# Patient Record
Sex: Male | Born: 1960
Health system: Southern US, Community
[De-identification: ages and names within clinical notes are randomized; demographics above are authoritative.]

## PROBLEM LIST (undated history)

## (undated) DIAGNOSIS — N429 Disorder of prostate, unspecified: Secondary | ICD-10-CM

## (undated) DIAGNOSIS — I4819 Other persistent atrial fibrillation: Secondary | ICD-10-CM

## (undated) DIAGNOSIS — G4733 Obstructive sleep apnea (adult) (pediatric): Secondary | ICD-10-CM

## (undated) DIAGNOSIS — F419 Anxiety disorder, unspecified: Secondary | ICD-10-CM

## (undated) DIAGNOSIS — Z9889 Other specified postprocedural states: Secondary | ICD-10-CM

## (undated) DIAGNOSIS — G2581 Restless legs syndrome: Secondary | ICD-10-CM

## (undated) DIAGNOSIS — R112 Nausea with vomiting, unspecified: Secondary | ICD-10-CM

## (undated) DIAGNOSIS — R011 Cardiac murmur, unspecified: Secondary | ICD-10-CM

## (undated) DIAGNOSIS — F32A Depression, unspecified: Secondary | ICD-10-CM

## (undated) DIAGNOSIS — Z9989 Dependence on other enabling machines and devices: Secondary | ICD-10-CM

## (undated) DIAGNOSIS — K219 Gastro-esophageal reflux disease without esophagitis: Secondary | ICD-10-CM

## (undated) DIAGNOSIS — M1990 Unspecified osteoarthritis, unspecified site: Secondary | ICD-10-CM

## (undated) DIAGNOSIS — E785 Hyperlipidemia, unspecified: Secondary | ICD-10-CM

## (undated) DIAGNOSIS — L299 Pruritus, unspecified: Secondary | ICD-10-CM

## (undated) DIAGNOSIS — F329 Major depressive disorder, single episode, unspecified: Secondary | ICD-10-CM

## (undated) HISTORY — PX: FOOT SURGERY: SHX648

## (undated) HISTORY — DX: Depression, unspecified: F32.A

## (undated) HISTORY — DX: Cardiac murmur, unspecified: R01.1

## (undated) HISTORY — DX: Pruritus, unspecified: L29.9

## (undated) HISTORY — PX: TRANSTHORACIC ECHOCARDIOGRAM: SHX275

## (undated) HISTORY — DX: Obstructive sleep apnea (adult) (pediatric): G47.33

## (undated) HISTORY — PX: SHOULDER SURGERY: SHX246

## (undated) HISTORY — DX: Gastro-esophageal reflux disease without esophagitis: K21.9

## (undated) HISTORY — DX: Anxiety disorder, unspecified: F41.9

## (undated) HISTORY — DX: Dependence on other enabling machines and devices: Z99.89

## (undated) HISTORY — DX: Hyperlipidemia, unspecified: E78.5

## (undated) HISTORY — DX: Other persistent atrial fibrillation: I48.19

## (undated) HISTORY — DX: Major depressive disorder, single episode, unspecified: F32.9

## (undated) HISTORY — DX: Unspecified osteoarthritis, unspecified site: M19.90

## (undated) HISTORY — DX: Disorder of prostate, unspecified: N42.9

## (undated) HISTORY — PX: VASECTOMY: SHX75

## (undated) HISTORY — DX: Restless legs syndrome: G25.81

---

## 2005-02-08 ENCOUNTER — Emergency Department: Payer: Self-pay | Admitting: Emergency Medicine

## 2005-06-25 ENCOUNTER — Ambulatory Visit: Payer: Self-pay | Admitting: Internal Medicine

## 2006-12-14 HISTORY — PX: CARDIAC CATHETERIZATION: SHX172

## 2007-03-15 HISTORY — PX: NM MYOVIEW LTD: HXRAD82

## 2007-03-24 ENCOUNTER — Ambulatory Visit: Payer: Self-pay | Admitting: Cardiovascular Disease

## 2007-12-11 ENCOUNTER — Other Ambulatory Visit: Payer: Self-pay

## 2007-12-11 ENCOUNTER — Emergency Department: Payer: Self-pay | Admitting: Emergency Medicine

## 2008-07-03 ENCOUNTER — Ambulatory Visit: Payer: Self-pay

## 2010-02-12 ENCOUNTER — Ambulatory Visit: Payer: Self-pay | Admitting: Cardiovascular Disease

## 2011-09-21 ENCOUNTER — Ambulatory Visit: Payer: Self-pay

## 2011-10-13 ENCOUNTER — Ambulatory Visit: Payer: Self-pay | Admitting: General Practice

## 2011-10-13 DIAGNOSIS — I4891 Unspecified atrial fibrillation: Secondary | ICD-10-CM

## 2011-10-28 ENCOUNTER — Ambulatory Visit: Payer: Self-pay | Admitting: General Practice

## 2011-12-15 HISTORY — PX: NM MYOVIEW LTD: HXRAD82

## 2011-12-23 ENCOUNTER — Ambulatory Visit: Payer: Self-pay | Admitting: Physician Assistant

## 2013-01-25 ENCOUNTER — Encounter: Payer: Self-pay | Admitting: *Deleted

## 2013-02-03 ENCOUNTER — Ambulatory Visit (INDEPENDENT_AMBULATORY_CARE_PROVIDER_SITE_OTHER): Payer: 59 | Admitting: Cardiovascular Disease

## 2013-02-03 ENCOUNTER — Encounter: Payer: Self-pay | Admitting: Cardiovascular Disease

## 2013-02-03 VITALS — BP 118/72 | HR 72 | Ht 71.0 in | Wt 191.5 lb

## 2013-02-03 DIAGNOSIS — I480 Paroxysmal atrial fibrillation: Secondary | ICD-10-CM

## 2013-02-03 DIAGNOSIS — E785 Hyperlipidemia, unspecified: Secondary | ICD-10-CM

## 2013-02-03 DIAGNOSIS — I2510 Atherosclerotic heart disease of native coronary artery without angina pectoris: Secondary | ICD-10-CM

## 2013-02-03 DIAGNOSIS — I4891 Unspecified atrial fibrillation: Secondary | ICD-10-CM

## 2013-02-03 NOTE — Patient Instructions (Addendum)
Continue same medications  Follow up in 1 year

## 2013-02-03 NOTE — Assessment & Plan Note (Signed)
He is maintaining in sinus rhythm with flecainide without any recent episodes. Continue current treatment. We discussed briefly today the option of radiofrequency ablation if he has recurrent atrial fibrillation in spite of being on flecainide. He had cardiac workup done within the last 6 months as outlined above.

## 2013-02-03 NOTE — Progress Notes (Signed)
HPI  This is a 52 year old male who is here today to reestablish cardiovascular care with me and transfer care from Dr. Welton Flakes. I saw him in the past [facility] for paroxysmal atrial fibrillation which was diagnosed in 2008. He has been maintaining in sinus rhythm on flecainide for many years. He had cardiac catheterization done in 2008 which showed no evidence of obstructive coronary artery disease. There was a 40% stenosis in the mid LAD. She reports no recent atrial fibrillation. He did have palpitations and likely A. fib last year in the summer. He had an echocardiogram done in July 2013 which showed normal LV systolic function with mildly dilated left atrium at 4.5 cm, mild left ventricular hypertrophy and mild tricuspid and mitral regurgitation. He also underwent a treadmill nuclear stress test which showed no evidence of ischemia with normal ejection fraction. He does have known history of sleep apnea on CPAP. He is doing very well at this time. 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 He denies any chest pain, dyspnea or palpitations.  No Known Allergies   Current Outpatient Prescriptions on File Prior to Visit  Medication Sig Dispense Refill  . aspirin 81 MG tablet Take 81 mg by mouth daily.      . flecainide (TAMBOCOR) 100 MG tablet Takes 3 tablets daily.      . sertraline (ZOLOFT) 100 MG tablet Take 100 mg by mouth daily.      . simvastatin (ZOCOR) 20 MG tablet Take 20 mg by mouth daily.       No current facility-administered medications on file prior to visit.     Past Medical History  Diagnosis Date  . Sleep apnea   . Hx of hyperlipidemia   . CAD (coronary artery disease)     Cardiac catheterization in April of 2008 : No obstructive coronary artery disease with 40 % mid LAD stenosis  . Hyperlipidemia   . Paroxysmal atrial fibrillation      Past Surgical History  Procedure Laterality Date  . Shoulder surgery Left   . Cardiac  catheterization  2008    [facility]     Family History  Problem Relation Age of Onset  . Hypertension Mother      History   Social History  . Marital Status: Married    Spouse Name: N/A    Number of Children: N/A  . Years of Education: N/A   Occupational History  . Not on file.   Social History Main Topics  . Smoking status: Former Smoker -- 0.50 packs/day for 10 years    Types: Cigarettes  . Smokeless tobacco: Not on file  . Alcohol Use: Yes     Comment: moderate  . Drug Use: No  . Sexually Active: Not on file   Other Topics Concern  . Not on file   Social History Narrative  . No narrative on file     ROS Constitutional: Negative for fever, chills, diaphoresis, activity change, appetite change and fatigue.  HENT: Negative for hearing loss, nosebleeds, congestion, sore throat, facial swelling, drooling, trouble swallowing, neck pain, voice change, sinus pressure and tinnitus.  Eyes: Negative for photophobia, pain, discharge and visual disturbance.  Respiratory: Negative for apnea, cough, chest tightness, shortness of breath and wheezing.  Cardiovascular: Negative for chest pain, palpitations and leg swelling.  Gastrointestinal: Negative for nausea, vomiting, abdominal pain, diarrhea, constipation, blood in stool and abdominal distention.  Genitourinary: Negative for dysuria, urgency, frequency, hematuria and  decreased urine volume.  Musculoskeletal: Negative for myalgias, back pain, joint swelling, arthralgias and gait problem.  Skin: Negative for color change, pallor, rash and wound.  Neurological: Negative for dizziness, tremors, seizures, syncope, speech difficulty, weakness, light-headedness, numbness and headaches.  Psychiatric/Behavioral: Negative for suicidal ideas, hallucinations, behavioral problems and agitation. The patient is not nervous/anxious.     PHYSICAL EXAM   BP 118/72  Pulse 72  Ht 5\' 11"  (1.803 m)  Wt 191 lb 8 oz (86.864 kg)  BMI 26.72  kg/m2 Constitutional: He is oriented to person, place, and time. He appears well-developed and well-nourished. No distress.  HENT: No nasal discharge.  Head: Normocephalic and atraumatic.  Eyes: Pupils are equal and round. Right eye exhibits no discharge. Left eye exhibits no discharge.  Neck: Normal range of motion. Neck supple. No JVD present. No thyromegaly present.  Cardiovascular: Normal rate, regular rhythm, normal heart sounds and. Exam reveals no gallop and no friction rub. No murmur heard.  Pulmonary/Chest: Effort normal and breath sounds normal. No stridor. No respiratory distress. He has no wheezes. He has no rales. He exhibits no tenderness.  Abdominal: Soft. Bowel sounds are normal. He exhibits no distension. There is no tenderness. There is no rebound and no guarding.  Musculoskeletal: Normal range of motion. He exhibits no edema and no tenderness.  Neurological: He is alert and oriented to person, place, and time. Coordination normal.  Skin: Skin is warm and dry. No rash noted. He is not diaphoretic. No erythema. No pallor.  Psychiatric: He has a normal mood and affect. His behavior is normal. Judgment and thought content normal.       EKG: Normal sinus rhythm with nonspecific IVCD and left axis deviation.   ASSESSMENT AND PLAN

## 2013-02-03 NOTE — Assessment & Plan Note (Signed)
He does have known history of mild nonobstructive coronary artery disease. He is currently on simvastatin 20 mg daily without any reported side effects.

## 2013-03-01 ENCOUNTER — Ambulatory Visit: Payer: Self-pay | Admitting: Gastroenterology

## 2013-03-02 LAB — PATHOLOGY REPORT

## 2013-11-17 ENCOUNTER — Other Ambulatory Visit: Payer: Self-pay | Admitting: *Deleted

## 2013-11-17 MED ORDER — FLECAINIDE ACETATE 100 MG PO TABS: ORAL_TABLET | ORAL | Status: DC

## 2013-11-17 NOTE — Telephone Encounter (Signed)
Requested Prescriptions   Signed Prescriptions Disp Refills  . flecainide (TAMBOCOR) 100 MG tablet 90 tablet 3    Sig: Takes 3 tablets daily.    Authorizing Provider: Lorine Bears A    Ordering User: Kendrick Fries

## 2014-02-20 ENCOUNTER — Ambulatory Visit (INDEPENDENT_AMBULATORY_CARE_PROVIDER_SITE_OTHER): Payer: 59 | Admitting: Cardiovascular Disease

## 2014-02-20 ENCOUNTER — Encounter: Payer: Self-pay | Admitting: Cardiovascular Disease

## 2014-02-20 VITALS — BP 121/79 | HR 72 | Ht 71.0 in | Wt 185.2 lb

## 2014-02-20 DIAGNOSIS — I480 Paroxysmal atrial fibrillation: Secondary | ICD-10-CM

## 2014-02-20 DIAGNOSIS — E785 Hyperlipidemia, unspecified: Secondary | ICD-10-CM

## 2014-02-20 DIAGNOSIS — I4891 Unspecified atrial fibrillation: Secondary | ICD-10-CM

## 2014-02-20 MED ORDER — FLECAINIDE ACETATE 150 MG PO TABS: 150.0000 mg | ORAL_TABLET | Freq: Two times a day (BID) | ORAL | Status: DC

## 2014-02-20 NOTE — Assessment & Plan Note (Signed)
Continue treatment with simvastatin. He is going to have labs done with Dr. Dossie Arbourrissman in the near future.

## 2014-02-20 NOTE — Progress Notes (Signed)
Primary care physician: Dr. Dossie Arbour  HPI  This is a 53 year old male who is here today for a followup visit regarding paroxysmal atrial fibrillation maintained in sinus rhythm with flecainide. He was diagnosed with paroxysmal atrial fibrillation in 2008. He has been maintaining in sinus rhythm on flecainide since then. He had cardiac catheterization done in 2008 which showed no evidence of obstructive coronary artery disease. There was a 40% stenosis in the mid LAD. Most recent echocardiogram echocardiogram done in July 2013 showed normal LV systolic function with mildly dilated left atrium at 4.5 cm, mild left ventricular hypertrophy and mild tricuspid and mitral regurgitation.  He had a treadmill nuclear stress test in 2013, and which showed no evidence of ischemia with normal ejection fraction. He does have known history of sleep apnea on CPAP. He is doing very well at this time. 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 He denies any chest pain, dyspnea or palpitations.  No Known Allergies   Current Outpatient Prescriptions on File Prior to Visit  Medication Sig Dispense Refill  . aspirin 81 MG tablet Take 81 mg by mouth daily.      . flecainide (TAMBOCOR) 100 MG tablet Takes 3 tablets daily.  90 tablet  3  . gabapentin (NEURONTIN) 100 MG capsule Take 100 mg by mouth daily.      . sertraline (ZOLOFT) 100 MG tablet Take 100 mg by mouth daily.      . simvastatin (ZOCOR) 20 MG tablet Take 20 mg by mouth daily.       No current facility-administered medications on file prior to visit.     Past Medical History  Diagnosis Date  . Sleep apnea   . Hx of hyperlipidemia   . CAD (coronary artery disease)     Cardiac catheterization in April of 2008 : No obstructive coronary artery disease with 40 % mid LAD stenosis  . Hyperlipidemia   . Paroxysmal atrial fibrillation      Past Surgical History  Procedure Laterality Date  . Shoulder surgery Left     . Cardiac catheterization  2008    [facility]     Family History  Problem Relation Age of Onset  . Hypertension Mother      History   Social History  . Marital Status: Married    Spouse Name: N/A    Number of Children: N/A  . Years of Education: N/A   Occupational History  . Not on file.   Social History Main Topics  . Smoking status: Former Smoker -- 0.50 packs/day for 10 years    Types: Cigarettes  . Smokeless tobacco: Not on file  . Alcohol Use: Yes     Comment: moderate  . Drug Use: No  . Sexual Activity: Not on file   Other Topics Concern  . Not on file   Social History Narrative  . No narrative on file        PHYSICAL EXAM   BP 121/79  Pulse 72  Ht 5\' 11"  (1.803 m)  Wt 185 lb 4 oz (84.029 kg)  BMI 25.85 kg/m2 Constitutional: He is oriented to person, place, and time. He appears well-developed and well-nourished. No distress.  HENT: No nasal discharge.  Head: Normocephalic and atraumatic.  Eyes: Pupils are equal and round. Right eye exhibits no discharge. Left eye exhibits no discharge.  Neck: Normal range of motion. Neck supple. No JVD present. No thyromegaly present.  Cardiovascular: Normal rate, regular rhythm,  normal heart sounds and. Exam reveals no gallop and no friction rub. No murmur heard.  Pulmonary/Chest: Effort normal and breath sounds normal. No stridor. No respiratory distress. He has no wheezes. He has no rales. He exhibits no tenderness.  Abdominal: Soft. Bowel sounds are normal. He exhibits no distension. There is no tenderness. There is no rebound and no guarding.  Musculoskeletal: Normal range of motion. He exhibits no edema and no tenderness.  Neurological: He is alert and oriented to person, place, and time. Coordination normal.  Skin: Skin is warm and dry. No rash noted. He is not diaphoretic. No erythema. No pallor.  Psychiatric: He has a normal mood and affect. His behavior is normal. Judgment and thought content normal.        ZOX:WRUEAEKG:Sinus  Rhythm  -RSR(V1) -nondiagnostic.   -Nonspecific ST depression  -Nondiagnostic. Normal PR and QT intervals  ABNORMAL    ASSESSMENT AND PLAN

## 2014-02-20 NOTE — Patient Instructions (Addendum)
Change Flecainide to 150 mg twice daily.    Your physician wants you to follow-up in: 12 months.  You will receive a reminder letter in the mail two months in advance. If you don't receive a letter, please call our office to schedule the follow-up appointment.

## 2014-02-20 NOTE — Assessment & Plan Note (Signed)
He is maintaining in sinus rhythm with flecainide. He has been taking 300 mg once daily. I advised him to change that to 150 mg twice daily given that this is not a sustained-release. I will plan on obtaining an echocardiogram next year to ensure no significant structural abnormalities or left ventricular hypertrophy.

## 2014-03-10 ENCOUNTER — Ambulatory Visit: Payer: Self-pay | Admitting: General Practice

## 2015-02-26 ENCOUNTER — Encounter: Payer: Self-pay | Admitting: Cardiovascular Disease

## 2015-02-26 ENCOUNTER — Ambulatory Visit (INDEPENDENT_AMBULATORY_CARE_PROVIDER_SITE_OTHER): Payer: 59 | Admitting: Cardiovascular Disease

## 2015-02-26 VITALS — BP 120/76 | HR 81 | Ht 71.5 in | Wt 179.8 lb

## 2015-02-26 DIAGNOSIS — I480 Paroxysmal atrial fibrillation: Secondary | ICD-10-CM

## 2015-02-26 DIAGNOSIS — E785 Hyperlipidemia, unspecified: Secondary | ICD-10-CM

## 2015-02-26 MED ORDER — FLECAINIDE ACETATE 150 MG PO TABS: 150.0000 mg | ORAL_TABLET | Freq: Two times a day (BID) | ORAL | Status: DC

## 2015-02-26 NOTE — Progress Notes (Signed)
Primary care physician: Dr. Dossie Arbourrissman  HPI  This is a 54 year old male who is here today for a followup visit regarding paroxysmal atrial fibrillation maintained in sinus rhythm with flecainide. He was diagnosed with paroxysmal atrial fibrillation in 2008. He has been maintaining in sinus rhythm on flecainide since then. He had cardiac catheterization done in 2008 which showed no evidence of obstructive coronary artery disease. There was a 40% stenosis in the mid LAD. Most recent echocardiogram echocardiogram done in July 2013 showed normal LV systolic function with mildly dilated left atrium at 4.5 cm, mild left ventricular hypertrophy and mild tricuspid and mitral regurgitation.  He had a treadmill nuclear stress test in 2013, and which showed no evidence of ischemia with normal ejection fraction. He does have known history of sleep apnea on CPAP. He is doing very well at this time. 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 He denies any chest pain, dyspnea or palpitations. He lost at least 10 pounds over the last 2 years with improvement and hyperlipidemia and overall stamina. He had only brief episodes of palpitations with no prolonged tachycardia.  No Known Allergies   Current Outpatient Prescriptions on File Prior to Visit  Medication Sig Dispense Refill  . aspirin 81 MG tablet Take 81 mg by mouth daily.    Marland Kitchen. gabapentin (NEURONTIN) 100 MG capsule Take 100 mg by mouth daily.    . sertraline (ZOLOFT) 100 MG tablet Take 100 mg by mouth daily.     No current facility-administered medications on file prior to visit.     Past Medical History  Diagnosis Date  . Sleep apnea   . Hx of hyperlipidemia   . CAD (coronary artery disease)     Cardiac catheterization in April of 2008 : No obstructive coronary artery disease with 40 % mid LAD stenosis  . Hyperlipidemia   . Paroxysmal atrial fibrillation      Past Surgical History  Procedure Laterality  Date  . Shoulder surgery Left   . Cardiac catheterization  2008    [facility]     Family History  Problem Relation Age of Onset  . Hypertension Mother      History   Social History  . Marital Status: Married    Spouse Name: N/A  . Number of Children: N/A  . Years of Education: N/A   Occupational History  . Not on file.   Social History Main Topics  . Smoking status: Former Smoker -- 0.50 packs/day for 10 years    Types: Cigarettes  . Smokeless tobacco: Not on file  . Alcohol Use: Yes     Comment: moderate  . Drug Use: No  . Sexual Activity: Not on file   Other Topics Concern  . Not on file   Social History Narrative        PHYSICAL EXAM   BP 120/76 mmHg  Pulse 81  Ht 5' 11.5" (1.816 m)  Wt 179 lb 12 oz (81.534 kg)  BMI 24.72 kg/m2 Constitutional: He is oriented to person, place, and time. He appears well-developed and well-nourished. No distress.  HENT: No nasal discharge.  Head: Normocephalic and atraumatic.  Eyes: Pupils are equal and round. Right eye exhibits no discharge. Left eye exhibits no discharge.  Neck: Normal range of motion. Neck supple. No JVD present. No thyromegaly present.  Cardiovascular: Normal rate, regular rhythm, normal heart sounds and. Exam reveals no gallop and no friction rub. No murmur heard.  Pulmonary/Chest:  Effort normal and breath sounds normal. No stridor. No respiratory distress. He has no wheezes. He has no rales. He exhibits no tenderness.  Abdominal: Soft. Bowel sounds are normal. He exhibits no distension. There is no tenderness. There is no rebound and no guarding.  Musculoskeletal: Normal range of motion. He exhibits no edema and no tenderness.  Neurological: He is alert and oriented to person, place, and time. Coordination normal.  Skin: Skin is warm and dry. No rash noted. He is not diaphoretic. No erythema. No pallor.  Psychiatric: He has a normal mood and affect. His behavior is normal. Judgment and thought content  normal.       ZOX:WRUEA  Rhythm  -Nonspecific QRS widening and anterior fascicular block.   ABNORMAL    ASSESSMENT AND PLAN

## 2015-02-26 NOTE — Assessment & Plan Note (Signed)
He is maintaining in sinus rhythm on flecainide. I made no changes in medications. I requested an echocardiogram to ensure no structural heart abnormalities.

## 2015-02-26 NOTE — Assessment & Plan Note (Signed)
He has lost weight and was able to get off simvastatin.

## 2015-02-26 NOTE — Patient Instructions (Signed)

## 2015-03-08 ENCOUNTER — Other Ambulatory Visit: Payer: 59

## 2015-03-19 ENCOUNTER — Other Ambulatory Visit (INDEPENDENT_AMBULATORY_CARE_PROVIDER_SITE_OTHER): Payer: 59

## 2015-03-19 ENCOUNTER — Other Ambulatory Visit: Payer: Self-pay

## 2015-03-19 DIAGNOSIS — I480 Paroxysmal atrial fibrillation: Secondary | ICD-10-CM | POA: Diagnosis not present

## 2015-04-17 ENCOUNTER — Other Ambulatory Visit (HOSPITAL_COMMUNITY)
Admission: RE | Admit: 2015-04-17 | Discharge: 2015-04-17 | Disposition: A | Discharge status: Home / Self Care | Payer: 59 | Source: Ambulatory Visit | Attending: Family Medicine | Admitting: Family Medicine

## 2015-04-17 DIAGNOSIS — E784 Other hyperlipidemia: Secondary | ICD-10-CM | POA: Insufficient documentation

## 2015-04-17 LAB — LIPID PANEL
CHOLESTEROL: 190 mg/dL (ref 0–200)
HDL: 31 mg/dL — AB (ref >40)
LDL Cholesterol: 119 mg/dL — ABNORMAL HIGH (ref 0–99)
Total CHOL/HDL Ratio: 6.1 RATIO
Triglycerides: 198 mg/dL — ABNORMAL HIGH (ref <150)
VLDL: 40 mg/dL (ref 0–40)

## 2015-04-17 LAB — AST: AST: 14 U/L — AB (ref 15–41)

## 2015-04-17 LAB — ALT: ALT: 16 U/L — AB (ref 17–63)

## 2015-07-29 ENCOUNTER — Other Ambulatory Visit: Payer: Self-pay | Admitting: Family Medicine

## 2015-07-29 ENCOUNTER — Telehealth: Payer: Self-pay | Admitting: Family Medicine

## 2015-07-29 NOTE — Telephone Encounter (Signed)
Wants to discuss coming off medications.

## 2015-07-29 NOTE — Telephone Encounter (Signed)
Discussed with patient not to start medication was to change his diet which is okay.

## 2015-08-28 ENCOUNTER — Telehealth: Payer: Self-pay | Admitting: Family Medicine

## 2015-08-28 ENCOUNTER — Ambulatory Visit (INDEPENDENT_AMBULATORY_CARE_PROVIDER_SITE_OTHER): Payer: 59 | Admitting: Cardiology

## 2015-08-28 ENCOUNTER — Telehealth: Payer: Self-pay | Admitting: *Deleted

## 2015-08-28 ENCOUNTER — Encounter: Payer: Self-pay | Admitting: Cardiology

## 2015-08-28 VITALS — BP 110/72 | HR 76 | Ht 71.0 in | Wt 190.5 lb

## 2015-08-28 DIAGNOSIS — R5383 Other fatigue: Secondary | ICD-10-CM | POA: Diagnosis not present

## 2015-08-28 DIAGNOSIS — E785 Hyperlipidemia, unspecified: Secondary | ICD-10-CM

## 2015-08-28 DIAGNOSIS — R110 Nausea: Secondary | ICD-10-CM

## 2015-08-28 DIAGNOSIS — I480 Paroxysmal atrial fibrillation: Secondary | ICD-10-CM

## 2015-08-28 MED ORDER — FLECAINIDE ACETATE 50 MG PO TABS: 50.0000 mg | ORAL_TABLET | Freq: Two times a day (BID) | ORAL | Status: DC

## 2015-08-28 NOTE — Progress Notes (Signed)
PATIENT: Aaron Watson MRN: [id number] DOB: Jun 21, 1961 PCP: Vonita Moss, MD  Primary Cardiologist: Dr. Kirke Corin  Clinic Note: Chief Complaint  Patient presents with  . other    Pt. c/o feeling fatigue, nausea at times and heart palpitations. Meds reviewed by the patient verbally.   . Nausea  . Atrial Fibrillation    PAF - no recent recurrences    HPI: Aaron Watson is a 54 y.o. male(pt. Of Dr. Kirke Corin) with a PMH below who presents today for urgent walk-in visit for fatigue, nausea & palpitations. [name] has a distant history of paroxysmal A. Fib, and was initially treated by Dr. Welton Flakes. He started all symptoms beta blockers and/or calcium channel blockers that made him feel fatigued and tired. He was therefore converted to flecainide. He did maintain sinus rhythm on flecainide at a relatively high dose of 150 mg twice a day since 2008. He has subsequently been followed up with Dr. Lorine Bears, and has maintained sinus rhythm with no symptoms  He lost at least 10 pounds over the last 2 years with improvement and hyperlipidemia and overall stamina. He had only brief episodes of palpitations with no prolonged tachycardia.  Last seen by Dr. Kirke Corin in March 2016 -- echocardiogram ordered  Studies Reviewed:    2D Echo 03/19/2015: Normal Study. - Left ventricle: The cavity size was normal. Wall thickness was normal. Systolic function was normal. The estimated ejection fraction was in the range of 55% to 60%. There were no regional wall motion abnormalities. .  Left ventricular diastolic function parameters were normal. - Mitral valve: There was trivial regurgitation. - Tricuspid valve: There was trivial regurgitation. - Pulmonary arteries: Systolic pressure was within the normal range.  Interval History: [name] presents today as a "urgent call-in" patient for unusual symptoms of fogginess/dizziness with nausea and near vomiting but no true vomiting. Simply dry heaving. He states  he had about 3 days of just not feeling well. He's felt nauseated with dry heaving. He's felt flushed and clammy at times. Overall just feeling very fatigued. He has not had any true syncope or near syncope symptoms. No rapid irregular heartbeats and palpitations to suggest recurrence of A. Fib. He can't recall having eaten or drank anything that would make him sick. Nothing to suggest food poisoning with diarrhea or loose stools. He really can't the last time he had a rapid heart rates but hasn't had any recently. No TIA or CVA symptoms. No chest pressure with rest or exertion. No dyspnea at rest or exertion.   Past Medical History  Diagnosis Date  . OSA on CPAP   . Hyperlipidemia with target LDL less than 100   . Coronary artery disease, non-occlusive 03/24/2007    Cardiac cath at [facility]: No obstructive coronary artery disease with 40 % mid LAD stenosis  . Hyperlipidemia   . Paroxysmal atrial fibrillation April 2008    Has been maintained in sinus rhythm on flecainide    Prior Cardiac Evaluation and Past Surgical History: Past Surgical History  Procedure Laterality Date  . Shoulder surgery Left   . Cardiac catheterization  2008    [facility]: 40% mid LAD lesion. Otherwise insignificant/minimal CAD.  . Transthoracic echocardiogram  July 20 13th; April 2016    At [facility]: a) normal LV function. Mild LA dilation, mild LVH and mild TR/MR. b) Normal LV size and function. EF 55-60%. No RW MA. Trivial MR and TR. Otherwise normal  . Nm myoview ltd  April 2008  Dr. Welton Flakes ([facility]) : Inferior wall defect, EF 55%. -- Suggested to the due to ischemia in RCA territory versus diaphragmatic attenuation normal EF.  Marland Kitchen Nm myoview ltd  2013    Treadmill Myoview [facility]) no evidence of ischemia or infarction. Normal EF.    No Known Allergies  Current Outpatient Prescriptions  Medication Sig Dispense Refill  . aspirin 81 MG tablet Take 81 mg by mouth daily.    Marland Kitchen gabapentin (NEURONTIN) 100 MG  capsule Take 100 mg by mouth daily.    . valACYclovir (VALTREX) 500 MG tablet TAKE 2 TABLETS BY MOUTH TWICE DAILY FOR 10 DAYS 40 tablet 13  . flecainide (TAMBOCOR) 50 MG tablet Take 1 tablet (50 mg total) by mouth 2 (two) times daily. 60 tablet 3   No current facility-administered medications for this visit.   Social & Family History:  reports that he has quit smoking. His smoking use included Cigarettes. He has a 5 pack-year smoking history. He does not have any smokeless tobacco history on file. He reports that he drinks alcohol. He reports that he does not use illicit drugs.  family history includes Hypertension in his mother.  ROS: A comprehensive Review of Systems - was performed Review of Systems  Constitutional: Positive for diaphoresis (He felt clammy with nausea). Negative for fever, chills and weight loss (He's actually gained back some of the weight that he had lost).  Cardiovascular: Negative for palpitations and claudication.  Gastrointestinal: Positive for nausea and vomiting (Dry heaving but no actual emesis). Negative for abdominal pain, diarrhea, blood in stool and melena.  Musculoskeletal: Negative for falls.  Neurological: Positive for headaches (associated with the dizziness etc.). Negative for sensory change, speech change, focal weakness and seizures.       Queasy/Foggy-headed  Psychiatric/Behavioral: Negative for depression. The patient is not nervous/anxious.    Wt Readings from Last 3 Encounters:  08/28/15 190 lb 8 oz (86.41 kg)  02/26/15 179 lb 12 oz (81.534 kg)  02/20/14 185 lb 4 oz (84.029 kg)    PHYSICAL EXAM BP 110/72 mmHg  Pulse 76  Ht 5\' 11"  (1.803 m)  Wt 190 lb 8 oz (86.41 kg)  BMI 26.58 kg/m2 General appearance: alert, cooperative, appears stated age, no distress and He does look a little bit ill, but nontoxic is complaining of a headache and nausea right now. HEENT: [place]/AT, EOMI, MMM, anicteric sclera Neck: no adenopathy, no carotid bruit, no JVD  and supple, symmetrical, trachea midline Lungs: clear to auscultation bilaterally, normal percussion bilaterally and Nonlabored, good air movement Heart: regular rate and rhythm, S1, S2 normal, no murmur, click, rub or gallop and normal apical impulse Abdomen: soft, non-tender; bowel sounds normal; no masses,  no organomegaly Extremities: extremities normal, atraumatic, no cyanosis or edema Pulses: 2+ and symmetric Skin: Skin color, texture, turgor normal. No rashes or lesions Neurologic: Alert and oriented X 3, normal strength and tone. Normal symmetric reflexes. Normal coordination and gait Mental status: Alert, oriented, thought content appropriate Cranial nerves: normal   Adult ECG Report  Rate: 76 ;  Rhythm: normal sinus rhythm and LAFB  QRS Axis: -59 (LAFB) ;  PR Interval: 194 (borderline 1 AV block) ;  QRS Duration: 124 ; QTc: 474; Voltages: normal   Narrative Interpretation: Otherwise normal EKG -- No notable change from prior study  Recent Labs:  Lab Results  Component Value Date   CHOL 190 04/17/2015   HDL 31* 04/17/2015   LDLCALC 119* 04/17/2015   TRIG 198* 04/17/2015  CHOLHDL 6.1 04/17/2015    ASSESSMENT / PLAN:   Problem List Items Addressed This Visit    Hyperlipidemia    Not currently on statin. Labs reviewed above. With his risk factors, probably at goal. Needs to continue  Diet and exercise to try to increase HDL.      Relevant Medications   flecainide (TAMBOCOR) 50 MG tablet   Nausea without vomiting    I really don't know what the true etiology of his symptoms is. We talked about potential things that he could be such as different things he could be in combination if any unusual foods. He's been eating and drinking okay with the exception of the fact it isn't nauseated now and not really wanting to. I did encourage him to stay hydrated at least. The only thing medication-wise is not concerned about is that he is on high-dose tonight. He asked about the  possibility of reducing the dose, I think this definitely reasonable thing to do. We'll gradually titrated down to 50 mg twice a day starting tonight with taking half a pill tonight and tomorrow. He was given a prescription for 50 mg twice a day filled tomorrow.      Other fatigue    Again, not sure what his symptoms are related to. I am backing off of his flecainide for a this will help. I don't see any other stigmata or signs/symptoms to suggest an illness other than maybe a mild viral GI bug. Recommended continuing to hydrate and try to eat bland simple foods that will make him throw up      Relevant Orders   EKG 12-Lead (Completed)   Paroxysmal atrial fibrillation - Primary    As noted, he is maintained sinus rhythm on flecainide, but he is on a high dose. No structural abnormality is noted on echo. I think we can definitely back off on his flecainide to see if he has any recurrence of symptoms. Has been 7 years. I wonder if flecainide to be the etiology of his nausea. Reducing to 50 mg twice a day as described in nausea section. We talked about when necessary "pill in the pocket" dosing -- 300 mg x1 then 100 mg twice a day for 2-3 days before reducing back to 50mg .      Relevant Medications   flecainide (TAMBOCOR) 50 MG tablet   Other Relevant Orders   EKG 12-Lead (Completed)      Meds ordered this encounter  Medications  . flecainide (TAMBOCOR) 50 MG tablet    Sig: Take 1 tablet (50 mg total) by mouth 2 (two) times daily.    Dispense:  60 tablet    Refill:  3    Medication Instructions:  Your physician has recommended you make the following change in your medication:  DECREASE flecainide to 50mg  twice per day Take 75mg  tonight and tomorrow morning (1/2 pill) then start 50mg  tomorrow evening.   Followup with Aaron Dunn, PA-C in one month, then as scheduled with Dr. Glean Salen, M.D., M.S.  [facility]  [address] [place], [place] [address] [phone] Fax [phone]

## 2015-08-28 NOTE — Patient Instructions (Signed)
Medication Instructions:  Your physician has recommended you make the following change in your medication:  DECREASE flecainide to  twice per day Take  tonight and tomorrow morning (1/2 pill) then start  tomorrow evening.   Labwork: none  Testing/Procedures: none  Follow-Up: Your physician recommends that you schedule a follow-up appointment in: one month with Eula Listen, PA-C   Any Other Special Instructions Will Be Listed Below (If Applicable).

## 2015-08-28 NOTE — Telephone Encounter (Signed)
Confirmed patient doing the right thing by call Cardiology

## 2015-08-28 NOTE — Telephone Encounter (Signed)
Pt called has been second guessing himself and feeling loopy and just not himself for about 3 days. Pt said his vitals were fine and that he was going to call his cardiologist to see if he could possibly get in today.

## 2015-08-28 NOTE — Telephone Encounter (Signed)
Pt c/o Syncope: STAT if syncope occurred within 30 minutes and pt complains of lightheadedness High Priority if episode of passing out, completely, today or in last 24 hours   1. Did you pass out today?no   2. When is the last time you passed out? Feeling dizzy, pale, tired  3. Has this occurred multiple times? 3 days  4. Did you have any symptoms prior to passing out? Didn't pass out just the feeling of being unsteady  5.

## 2015-08-28 NOTE — Telephone Encounter (Signed)
S/w pt who states who has been feeling "loopy, dizzy, foggy brain" for three days. Denies any new medication. States he took BP at work today and it was "ok". Offered for pt to be seen today by Dr. Herbie Baltimore. Pt agreeable to 3:45pm appt.

## 2015-08-30 ENCOUNTER — Encounter: Payer: Self-pay | Admitting: Cardiology

## 2015-08-30 DIAGNOSIS — R5383 Other fatigue: Secondary | ICD-10-CM | POA: Insufficient documentation

## 2015-08-30 NOTE — Assessment & Plan Note (Signed)
Not currently on statin. Labs reviewed above. With his risk factors, probably at goal. Needs to continue  Diet and exercise to try to increase HDL.

## 2015-08-30 NOTE — Assessment & Plan Note (Signed)
As noted, he is maintained sinus rhythm on flecainide, but he is on a high dose. No structural abnormality is noted on echo. I think we can definitely back off on his flecainide to see if he has any recurrence of symptoms. Has been 7 years. I wonder if flecainide to be the etiology of his nausea. Reducing to 50 mg twice a day as described in nausea section. We talked about when necessary "pill in the pocket" dosing -- 300 mg x1 then 100 mg twice a day for 2-3 days before reducing back to .

## 2015-08-30 NOTE — Assessment & Plan Note (Signed)
I really don't know what the true etiology of his symptoms is. We talked about potential things that he could be such as different things he could be in combination if any unusual foods. He's been eating and drinking okay with the exception of the fact it isn't nauseated now and not really wanting to. I did encourage him to stay hydrated at least. The only thing medication-wise is not concerned about is that he is on high-dose tonight. He asked about the possibility of reducing the dose, I think this definitely reasonable thing to do. We'll gradually titrated down to 50 mg twice a day starting tonight with taking half a pill tonight and tomorrow. He was given a prescription for 50 mg twice a day filled tomorrow.

## 2015-08-30 NOTE — Assessment & Plan Note (Signed)
Again, not sure what his symptoms are related to. I am backing off of his flecainide for a this will help. I don't see any other stigmata or signs/symptoms to suggest an illness other than maybe a mild viral GI bug. Recommended continuing to hydrate and try to eat bland simple foods that will make him throw up

## 2015-09-27 ENCOUNTER — Ambulatory Visit: Payer: 59 | Admitting: Nurse Practitioner

## 2015-10-17 ENCOUNTER — Other Ambulatory Visit: Payer: Self-pay

## 2015-10-17 ENCOUNTER — Telehealth: Payer: Self-pay | Admitting: *Deleted

## 2015-10-17 ENCOUNTER — Telehealth: Payer: Self-pay | Admitting: Family Medicine

## 2015-10-17 MED ORDER — FLECAINIDE ACETATE 50 MG PO TABS: 50.0000 mg | ORAL_TABLET | Freq: Two times a day (BID) | ORAL | Status: DC

## 2015-10-17 MED ORDER — FLECAINIDE ACETATE 150 MG PO TABS: 150.0000 mg | ORAL_TABLET | Freq: Two times a day (BID) | ORAL | Status: DC

## 2015-10-17 NOTE — Telephone Encounter (Signed)
Pt called would like all medications sent in 90 day supplies [facility]. Thanks.

## 2015-10-17 NOTE — Telephone Encounter (Signed)
pt wanted flecainide 100 mg sent in to pharmacy because he titrated himself back to that dose, i do not see any documentation from Dr. Herbie BaltimoreHarding regarding this so i will route to his RN for review and advisement. he was titrated down to 50 mg in sep 2016.

## 2015-10-17 NOTE — Telephone Encounter (Signed)
OK to refill Rx for Flecainide 100 mg Aaron Watson, Piedad ClimesAVID W, MD  Will forward to Dr. Kirke CorinArida

## 2015-10-17 NOTE — Telephone Encounter (Signed)
LAST VISIT: 08/28/2015  Patient would like a 90 day supply on all of his medications. Gabapentin 100mg  Valacyclovire 500mg  Crestor 10mg   The flecainide is prescribed from another provider. Told patient to contact him for a request for a 90 day supply.

## 2015-10-17 NOTE — Telephone Encounter (Signed)
*  STAT* If patient is at the pharmacy, call can be transferred to refill team.   1. Which medications need to be refilled? (please list name of each medication and dose if known) flecainide (TAMBOCOR) 50 MG tablet  2. Which pharmacy/location (including street and city if local pharmacy) is medication to be sent [facility]  3. Do they need a 30 day or 90 day supply? 90 day

## 2015-10-17 NOTE — Telephone Encounter (Signed)
SPOKE TO PATIENT  PATIENT SATES HE WAS ONLY ABLE TO GO A FEW DAYS WITH 50 MG TWICE A DAY HE DEVELOP BREAK THROUGH. PATIENT RESTARTED THE ORIGINAL DOSE-150 MG TWICE A DAY. PATIENT WAS TAKING 100 MG +50 MG TABLET T0TAL 150 MG  RN SPOKE TO DR HARDING , VERBAL ORDER GIVEN TO ORDER NEW DOSE FOR PATIENT. E-SENT PRESCRIPTION  TO  #90 SUPPLY-3 REFILLS.  PATIENT AWARE.

## 2015-10-18 NOTE — Telephone Encounter (Signed)
Pt called wants to know if there is any way the refills for 90 day supply can be sent to the pharmacy today. Pharm [facility]. Thanks.

## 2015-10-21 MED ORDER — ROSUVASTATIN CALCIUM 10 MG PO TABS: 10.0000 mg | ORAL_TABLET | Freq: Every day | ORAL | Status: DC

## 2015-10-21 MED ORDER — VALACYCLOVIR HCL 500 MG PO TABS: 500.0000 mg | ORAL_TABLET | Freq: Three times a day (TID) | ORAL | Status: DC

## 2015-10-21 MED ORDER — GABAPENTIN 100 MG PO CAPS: 100.0000 mg | ORAL_CAPSULE | Freq: Three times a day (TID) | ORAL | Status: DC

## 2015-10-28 ENCOUNTER — Telehealth: Payer: Self-pay | Admitting: Family Medicine

## 2015-10-28 NOTE — Telephone Encounter (Signed)
Pt called stated he needs to know what he has to do to get a new CPAP machine. Does he need a new RX or does he have to have another sleep study. Please call and advise the pt @ [phone]. Thanks.

## 2015-10-28 NOTE — Telephone Encounter (Signed)
Spoke with patient, he would like to see about getting a new mask. I told him to contact [facility] and see what the proper steps are to get this done.

## 2015-12-03 ENCOUNTER — Telehealth: Payer: Self-pay

## 2015-12-03 ENCOUNTER — Telehealth: Payer: Self-pay | Admitting: Family Medicine

## 2015-12-03 MED ORDER — TADALAFIL 5 MG PO TABS: 5.0000 mg | ORAL_TABLET | Freq: Every day | ORAL | Status: DC | PRN

## 2015-12-03 NOTE — Telephone Encounter (Signed)
Pt would like Cialis called into [facility], would like to speak with Harriett SineNancy or Dr Dossie Arbourrissman as to the amount he can get.  He would like to use the remainder of his flex spending if possible.

## 2015-12-03 NOTE — Telephone Encounter (Signed)
-----   Message from Steele SizerMark A Crissman, MD sent at 12/03/2015 12:10 PM EST ----- Pt would like Cialis called into [facility], would like to speak with Harriett SineNancy or Dr Dossie Arbourrissman as to the amount he can get. He would like to use the remainder of his flex spending if possible

## 2015-12-24 ENCOUNTER — Telehealth: Payer: Self-pay | Admitting: Family Medicine

## 2015-12-24 MED ORDER — GABAPENTIN 100 MG PO CAPS: 300.0000 mg | ORAL_CAPSULE | Freq: Three times a day (TID) | ORAL | Status: DC

## 2015-12-24 MED FILL — GABAPENTIN 100 MG CAPSULE: 100 | 30 days supply | Qty: 270 | Fill #0 | Status: TO

## 2015-12-24 NOTE — Telephone Encounter (Signed)
Pt read rx for gabapentin incorrectly and has been taking 3 pills three times a day instead of 1 pill three times a day. He went to the pharmacy to get refill and they informed him that he should still have 20 days left and he then realized that he had been taking them incorrectly. He would like to have a refill called in to [facility].

## 2015-12-24 NOTE — Telephone Encounter (Signed)
Rx sent to his pharmacy

## 2016-01-13 ENCOUNTER — Encounter: Payer: Self-pay | Admitting: Family Medicine

## 2016-01-13 ENCOUNTER — Ambulatory Visit (INDEPENDENT_AMBULATORY_CARE_PROVIDER_SITE_OTHER): Payer: Managed Care, Other (non HMO) | Admitting: Family Medicine

## 2016-01-13 VITALS — BP 126/81 | HR 69 | Temp 98.2°F | Ht 69.2 in | Wt 190.0 lb

## 2016-01-13 DIAGNOSIS — R6882 Decreased libido: Secondary | ICD-10-CM | POA: Diagnosis not present

## 2016-01-13 DIAGNOSIS — L299 Pruritus, unspecified: Secondary | ICD-10-CM

## 2016-01-13 MED ORDER — HYDROXYZINE HCL 10 MG PO TABS: 10.0000 mg | ORAL_TABLET | Freq: Three times a day (TID) | ORAL | Status: DC | PRN

## 2016-01-13 NOTE — Progress Notes (Signed)
BP 126/81 mmHg  Pulse 69  Temp(Src) 98.2 F (36.8 C)  Ht 5' 9.2" (1.758 m)  Wt 190 lb (86.183 kg)  BMI 27.89 kg/m2  SpO2 99%   Subjective:    Patient ID: Aaron Watson, male    DOB: 07/08/1961, 55 y.o.   MRN: [id number]  HPI: Aaron Watson is a 55 y.o. male  Chief Complaint  Patient presents with  . Pruritis    Patient has tried all otc remedies to fight this, nothing has worked.   Itching- has been having problems with itching all over his skin from his head to his toes. Constant for 3 months. Has been using aveno and oatmeal baths and it doesn't seem like it's getting better. Has been using a natural testosterone cream recently, but the itching has been going on since before that.  Duration: 3 months Runny nose: yes  Nasal congestion: no Nasal itching: yes Sneezing: no Eye swelling, itching or discharge: no Post nasal drip: no Cough: yes Sinus pressure: no  Ear pain: no  Ear pressure: no  Fever: no  Symptoms occur seasonally: no Symptoms occur perenially: no Satisfied with current treatment: no Allergist evaluation in past: no Allergen injection immunotherapy: no Recurrent sinus infections: no ENT evaluation in past: no Known environmental allergy: no Indoor pets: no History of asthma: no Current allergy medications: none Treatments attempted: aveno, oatmeal baths,cortisone and benadryl  Gets little pustules on his skin, then pop and itch a lot when it comes on. Has tried benadryl and cortisone without much benefit.   ??LOW TESTOSTERONE Duration: chronic Status: uncontrolled  Satisfied with current treatment:  No, just taking OTC cream Previous testosterone therapies: none Decreased libido: yes Fatigue: yes Depressed mood: no Muscle weakness: no Erectile dysfunction: yes  Relevant past medical, surgical, family and social history reviewed and updated as indicated. Interim medical history since our last visit reviewed. Allergies and medications  reviewed and updated.  Review of Systems  Constitutional: Negative.   Respiratory: Negative.   Cardiovascular: Negative.   Musculoskeletal: Negative.   Skin: Negative for color change, pallor, rash and wound.       + itching, occasional bumps  Psychiatric/Behavioral: Negative.    Per HPI unless specifically indicated above    Objective:    BP 126/81 mmHg  Pulse 69  Temp(Src) 98.2 F (36.8 C)  Ht 5' 9.2" (1.758 m)  Wt 190 lb (86.183 kg)  BMI 27.89 kg/m2  SpO2 99%  Wt Readings from Last 3 Encounters:  01/13/16 190 lb (86.183 kg)  04/24/15 183 lb (83.008 kg)  08/28/15 190 lb 8 oz (86.41 kg)    Physical Exam  Constitutional: He is oriented to person, place, and time. He appears well-developed and well-nourished. No distress.  HENT:  Head: Normocephalic and atraumatic.  Right Ear: Hearing normal.  Left Ear: Hearing normal.  Nose: Nose normal.  Eyes: Conjunctivae and lids are normal. Right eye exhibits no discharge. Left eye exhibits no discharge. No scleral icterus.  Pulmonary/Chest: Effort normal. No respiratory distress.  Musculoskeletal: Normal range of motion.  Neurological: He is alert and oriented to person, place, and time.  Skin: Skin is warm, dry and intact. No rash noted. No erythema. No pallor.  Psychiatric: He has a normal mood and affect. His speech is normal and behavior is normal. Judgment and thought content normal. Cognition and memory are normal.  Nursing note and vitals reviewed.   Results for orders placed or performed during the hospital encounter of  04/17/15  Lipid panel  Result Value Ref Range   Cholesterol 190 0 - 200 mg/dL   Triglycerides 161 (H) <150 mg/dL   HDL 31 (L) >09 mg/dL   Total CHOL/HDL Ratio 6.1 RATIO   VLDL 40 0 - 40 mg/dL   LDL Cholesterol 604 (H) 0 - 99 mg/dL  ALT  Result Value Ref Range   ALT 16 (L) 17 - 63 U/L  AST  Result Value Ref Range   AST 14 (L) 15 - 41 U/L      Assessment & Plan:   Problem List Items Addressed  This Visit    None    Visit Diagnoses    Itching    -  Primary    Will check labs. Discussed gentle skin cleansing. Will start hydroxyzine. Consider zantac or 2nd generation anti-histamine. Call if not getting better or worse.    Relevant Orders    CBC with Differential/Platelet    Comprehensive metabolic panel    TSH    VITAMIN D 25 Hydroxy (Vit-D Deficiency, Fractures)    Decreased libido        Will check testosterone levels. Order given to patient. Await results.     Relevant Orders    Testosterone, free, total        Follow up plan: Return if symptoms worsen or fail to improve.

## 2016-01-13 NOTE — Patient Instructions (Addendum)
Gentle Skin Cleansing: No soap except in face, armpits and groin and with soiled areas Let water just rinse the other areas No wash clothes, loofahs, sponges Pat dry when you get out of the shower Vasaline BID when you get out of the shower  Medicine for itching may make you sleepy, see how it effects you before driving on it or working on it.   You can try OTC claritin, allegra or zyrtec to see if that helps your itch You can also try zantac.   I'll call you with the results of your blood work when we get it back. Hydroxyzine capsules or tablets What is this medicine? HYDROXYZINE (hye DROX i zeen) is an antihistamine. This medicine is used to treat allergy symptoms. It is also used to treat anxiety and tension. This medicine can be used with other medicines to induce sleep before surgery. This medicine may be used for other purposes; ask your health care provider or pharmacist if you have questions. What should I tell my health care provider before I take this medicine? They need to know if you have any of these conditions: -any chronic illness -difficulty passing urine -glaucoma -heart disease -kidney disease -liver disease -lung disease -an unusual or allergic reaction to hydroxyzine, cetirizine, other medicines, foods, dyes, or preservatives -pregnant or trying to get pregnant -breast-feeding How should I use this medicine? Take this medicine by mouth with a full glass of water. Follow the directions on the prescription label. You may take this medicine with food or on an empty stomach. Take your medicine at regular intervals. Do not take your medicine more often than directed. Talk to your pediatrician regarding the use of this medicine in children. Special care may be needed. While this drug may be prescribed for children as young as 71 years of age for selected conditions, precautions do apply. Patients over 62 years old may have a stronger reaction and need a smaller  dose. Overdosage: If you think you have taken too much of this medicine contact a poison control center or emergency room at once. NOTE: This medicine is only for you. Do not share this medicine with others. What if I miss a dose? If you miss a dose, take it as soon as you can. If it is almost time for your next dose, take only that dose. Do not take double or extra doses. What may interact with this medicine? -alcohol -barbiturate medicines for sleep or seizures -medicines for colds, allergies -medicines for depression, anxiety, or emotional disturbances -medicines for pain -medicines for sleep -muscle relaxants This list may not describe all possible interactions. Give your health care provider a list of all the medicines, herbs, non-prescription drugs, or dietary supplements you use. Also tell them if you smoke, drink alcohol, or use illegal drugs. Some items may interact with your medicine. What should I watch for while using this medicine? Tell your doctor or health care professional if your symptoms do not improve. You may get drowsy or dizzy. Do not drive, use machinery, or do anything that needs mental alertness until you know how this medicine affects you. Do not stand or sit up quickly, especially if you are an older patient. This reduces the risk of dizzy or fainting spells. Alcohol may interfere with the effect of this medicine. Avoid alcoholic drinks. Your mouth may get dry. Chewing sugarless gum or sucking hard candy, and drinking plenty of water may help. Contact your doctor if the problem does not go away or  is severe. This medicine may cause dry eyes and blurred vision. If you wear contact lenses you may feel some discomfort. Lubricating drops may help. See your eye doctor if the problem does not go away or is severe. If you are receiving skin tests for allergies, tell your doctor you are using this medicine. What side effects may I notice from receiving this medicine? Side  effects that you should report to your doctor or health care professional as soon as possible: -fast or irregular heartbeat -difficulty passing urine -seizures -slurred speech or confusion -tremor Side effects that usually do not require medical attention (report to your doctor or health care professional if they continue or are bothersome): -constipation -drowsiness -fatigue -headache -stomach upset This list may not describe all possible side effects. Call your doctor for medical advice about side effects. You may report side effects to FDA at 1-800-FDA-1088. Where should I keep my medicine? Keep out of the reach of children. Store at room temperature between 15 and 30 degrees C (59 and 86 degrees F). Keep container tightly closed. Throw away any unused medicine after the expiration date. NOTE: This sheet is a summary. It may not cover all possible information. If you have questions about this medicine, talk to your doctor, pharmacist, or health care provider.    2016, Elsevier/Gold Standard. (2008-04-13 14:50:59)

## 2016-01-14 ENCOUNTER — Encounter: Payer: Self-pay | Admitting: Family Medicine

## 2016-01-14 ENCOUNTER — Telehealth: Payer: Self-pay | Admitting: Family Medicine

## 2016-01-14 ENCOUNTER — Other Ambulatory Visit (HOSPITAL_COMMUNITY)
Admission: RE | Admit: 2016-01-14 | Discharge: 2016-01-14 | Disposition: A | Discharge status: Home / Self Care | Payer: Managed Care, Other (non HMO) | Source: Ambulatory Visit | Attending: Family Medicine | Admitting: Family Medicine

## 2016-01-14 DIAGNOSIS — R6882 Decreased libido: Secondary | ICD-10-CM | POA: Diagnosis present

## 2016-01-14 LAB — COMPREHENSIVE METABOLIC PANEL
ALT: 24 IU/L (ref 0–44)
AST: 25 IU/L (ref 0–40)
Albumin/Globulin Ratio: 2 (ref 1.1–2.5)
Albumin: 4.4 g/dL (ref 3.5–5.5)
Alkaline Phosphatase: 62 IU/L (ref 39–117)
BILIRUBIN TOTAL: 0.4 mg/dL (ref 0.0–1.2)
BUN/Creatinine Ratio: 15 (ref 9–20)
BUN: 15 mg/dL (ref 6–24)
CHLORIDE: 98 mmol/L (ref 96–106)
CO2: 23 mmol/L (ref 18–29)
Calcium: 9.6 mg/dL (ref 8.7–10.2)
Creatinine, Ser: 1.02 mg/dL (ref 0.76–1.27)
GFR calc Af Amer: 97 mL/min/{1.73_m2} (ref >59)
GFR calc non Af Amer: 84 mL/min/{1.73_m2} (ref >59)
Globulin, Total: 2.2 g/dL (ref 1.5–4.5)
Glucose: 84 mg/dL (ref 65–99)
Potassium: 3.9 mmol/L (ref 3.5–5.2)
Sodium: 137 mmol/L (ref 134–144)
Total Protein: 6.6 g/dL (ref 6.0–8.5)

## 2016-01-14 LAB — CBC WITH DIFFERENTIAL/PLATELET
BASOS: 1 %
Basophils Absolute: 0 10*3/uL (ref 0.0–0.2)
EOS (ABSOLUTE): 0.1 10*3/uL (ref 0.0–0.4)
EOS: 2 %
HEMATOCRIT: 42.7 % (ref 37.5–51.0)
HEMOGLOBIN: 15 g/dL (ref 12.6–17.7)
IMMATURE GRANULOCYTES: 0 %
Immature Grans (Abs): 0 10*3/uL (ref 0.0–0.1)
LYMPHS: 27 %
Lymphocytes Absolute: 1.8 10*3/uL (ref 0.7–3.1)
MCH: 31.5 pg (ref 26.6–33.0)
MCHC: 35.1 g/dL (ref 31.5–35.7)
MCV: 90 fL (ref 79–97)
Monocytes Absolute: 0.5 10*3/uL (ref 0.1–0.9)
Monocytes: 7 %
NEUTROS ABS: 4.2 10*3/uL (ref 1.4–7.0)
NEUTROS PCT: 63 %
PLATELETS: 260 10*3/uL (ref 150–379)
RBC: 4.76 x10E6/uL (ref 4.14–5.80)
RDW: 13.6 % (ref 12.3–15.4)
WBC: 6.6 10*3/uL (ref 3.4–10.8)

## 2016-01-14 LAB — TSH: TSH: 1.59 u[IU]/mL (ref 0.450–4.500)

## 2016-01-14 LAB — VITAMIN D 25 HYDROXY (VIT D DEFICIENCY, FRACTURES): VIT D 25 HYDROXY: 16.4 ng/mL — AB (ref 30.0–100.0)

## 2016-01-14 MED ORDER — VITAMIN D (ERGOCALCIFEROL) 1.25 MG (50000 UNIT) PO CAPS: 50000.0000 [IU] | ORAL_CAPSULE | ORAL | Status: DC

## 2016-01-14 MED FILL — VIT D2 1.25 MG (50,000 UNIT: 1.25 MG | 56 days supply | Qty: 8 | Fill #0 | Status: TO

## 2016-01-14 NOTE — Telephone Encounter (Signed)
Patient aware of results through my chart. No need to call him.

## 2016-01-14 NOTE — Telephone Encounter (Signed)
Please let him know that his labs all came back normal except his vitamin D which came back quite low. I've sent him through a rx for vitamin D. Thanks!

## 2016-01-15 LAB — TESTOSTERONE,FREE AND TOTAL
Testosterone, Free: 19.1 pg/mL (ref 7.2–24.0)
Testosterone: 379 ng/dL (ref 348–1197)

## 2016-01-20 ENCOUNTER — Other Ambulatory Visit (HOSPITAL_COMMUNITY)
Admission: RE | Admit: 2016-01-20 | Discharge: 2016-01-20 | Disposition: A | Discharge status: Home / Self Care | Payer: Managed Care, Other (non HMO) | Source: Ambulatory Visit | Attending: Dermatology | Admitting: Dermatology

## 2016-01-20 DIAGNOSIS — L299 Pruritus, unspecified: Secondary | ICD-10-CM | POA: Diagnosis present

## 2016-01-20 LAB — IRON AND TIBC
IRON: 52 ug/dL (ref 45–182)
Saturation Ratios: 14 % — ABNORMAL LOW (ref 17.9–39.5)
TIBC: 370 ug/dL (ref 250–450)
UIBC: 318 ug/dL

## 2016-01-21 LAB — HEPATITIS B E ANTIGEN: HEP B E AG: NEGATIVE

## 2016-01-21 LAB — HEPATITIS C ANTIBODY: HCV Ab: <0.1 s/co ratio (ref 0.0–0.9)

## 2016-01-21 LAB — HEPATITIS B SURFACE ANTIGEN: Hepatitis B Surface Ag: NEGATIVE

## 2016-01-21 LAB — HEPATITIS B E ANTIBODY: HEP B E AB: NEGATIVE

## 2016-01-21 LAB — HEPATITIS A ANTIBODY, IGM: Hep A IgM: NEGATIVE

## 2016-01-23 LAB — MISC LABCORP TEST (SEND OUT): Labcorp test code: 6734

## 2016-01-23 LAB — HIV ANTIBODY (ROUTINE TESTING W REFLEX): HIV SCREEN 4TH GENERATION: NONREACTIVE

## 2016-01-27 MED FILL — FLECAINIDE ACETATE 150 MG T: 150 | 90 days supply | Qty: 180 | Fill #1 | Status: TO

## 2016-02-03 MED FILL — ROSUVASTATIN CALCIUM 10 MG: 10 | 90 days supply | Qty: 90 | Fill #1 | Status: TO

## 2016-02-05 ENCOUNTER — Encounter: Payer: Self-pay | Admitting: Family Medicine

## 2016-02-05 ENCOUNTER — Telehealth: Payer: Self-pay | Admitting: Family Medicine

## 2016-02-05 NOTE — Telephone Encounter (Signed)
He will need an appointment as I have only seen him for decreased libido and itching.

## 2016-02-05 NOTE — Telephone Encounter (Signed)
Forward to provider

## 2016-02-05 NOTE — Telephone Encounter (Signed)
Pt would like to get a referral to [facility]. Pt would like a call back once referral is entered.

## 2016-02-05 NOTE — Telephone Encounter (Signed)
Christan: Please get patient scheduled.

## 2016-02-06 ENCOUNTER — Ambulatory Visit (INDEPENDENT_AMBULATORY_CARE_PROVIDER_SITE_OTHER): Payer: Managed Care, Other (non HMO) | Admitting: Family Medicine

## 2016-02-06 ENCOUNTER — Encounter: Payer: Self-pay | Admitting: Family Medicine

## 2016-02-06 VITALS — BP 128/87 | HR 83 | Temp 98.8°F | Wt 193.0 lb

## 2016-02-06 DIAGNOSIS — L299 Pruritus, unspecified: Secondary | ICD-10-CM

## 2016-02-06 NOTE — Progress Notes (Signed)
BP 128/87 mmHg  Pulse 83  Temp(Src) 98.8 F (37.1 C)  Wt 193 lb (87.544 kg)  SpO2 96%   Subjective:    Patient ID: Aaron Watson, male    DOB: March 12, 1961, 55 y.o.   MRN: [id number]  HPI: Aaron Watson is a 55 y.o. male  Chief Complaint  Patient presents with  . Pruritis    Patient states that he can not take it anymore, he has been itching for four months    Itching- has been having problems with itching all over his skin from his head to his toes. Constant for 4 months. Has been using aveno and oatmeal baths and it doesn't seem like it's getting better. Has been doing gentle skin cleansing and following the instructions of the allergist and the dermatologist. Saw derm a bit ago. Had other blood work done with them. They didn't see anything. They tried him on antihistamines Saw allergist 1 week ago. Allergy medicine changed and hyrdoxyzine increased. Allergic to mold spores and dust mites. Has gotten rid of curtains, rugs and got a new mattress. He is not noticing any difference. He is very frustrated. He would like to see neurology for evaluation of the itching as he is bruising himself when he is sleeping and cannot figure out why this is going on.  Duration: 4 months Runny nose: yes  Nasal congestion: no Nasal itching: yes Sneezing: no Eye swelling, itching or discharge: no Post nasal drip: no Cough: yes Sinus pressure: no  Ear pain: no  Ear pressure: no  Fever: no  Symptoms occur seasonally: no Symptoms occur perenially: no Satisfied with current treatment: no Allergist evaluation in past: yes Allergen injection immunotherapy: no Recurrent sinus infections: no ENT evaluation in past: no Known environmental allergy: no Indoor pets: no History of asthma: no Current allergy medications: none Treatments attempted: aveno, oatmeal baths,cortisone and benadryl Relevant past medical, surgical, family and social history reviewed and updated as indicated. Interim  medical history since our last visit reviewed. Allergies and medications reviewed and updated.  Review of Systems  Constitutional: Negative.   Respiratory: Negative.   Cardiovascular: Negative.   Skin: Negative.  Negative for color change, pallor, rash and wound.  Psychiatric/Behavioral: Negative.     Per HPI unless specifically indicated above     Objective:    BP 128/87 mmHg  Pulse 83  Temp(Src) 98.8 F (37.1 C)  Wt 193 lb (87.544 kg)  SpO2 96%  Wt Readings from Last 3 Encounters:  02/06/16 193 lb (87.544 kg)  01/13/16 190 lb (86.183 kg)  04/24/15 183 lb (83.008 kg)    Physical Exam  Constitutional: He is oriented to person, place, and time. He appears well-developed and well-nourished. No distress.  HENT:  Head: Normocephalic and atraumatic.  Right Ear: Hearing normal.  Left Ear: Hearing normal.  Nose: Nose normal.  Eyes: Conjunctivae, EOM and lids are normal. Pupils are equal, round, and reactive to light. Right eye exhibits no discharge. Left eye exhibits no discharge. No scleral icterus.  Cardiovascular: Normal rate, regular rhythm, normal heart sounds and intact distal pulses.  Exam reveals no gallop and no friction rub.   No murmur heard. Pulmonary/Chest: Effort normal and breath sounds normal. No respiratory distress. He has no wheezes. He has no rales. He exhibits no tenderness.  Musculoskeletal: Normal range of motion.  Neurological: He is alert and oriented to person, place, and time.  Skin: Skin is warm, dry and intact. No rash noted. No erythema.  No pallor.  Psychiatric: He has a normal mood and affect. His speech is normal and behavior is normal. Judgment and thought content normal. Cognition and memory are normal.  Nursing note and vitals reviewed.   Results for orders placed or performed during the hospital encounter of 01/20/16  Iron and TIBC  Result Value Ref Range   Iron 52 45 - 182 ug/dL   TIBC 161 096 - 045 ug/dL   Saturation Ratios 14 (L) 17.9  - 39.5 %   UIBC 318 ug/dL  Hepatitis B surface antigen  Result Value Ref Range   Hepatitis B Surface Ag Negative Negative  Hepatitis B e antibody  Result Value Ref Range   Hep B E Ab Negative Negative  Hepatitis B e antigen  Result Value Ref Range   Hep B E Ag Negative Negative  Hepatitis C antibody  Result Value Ref Range   HCV Ab <0.1 0.0 - 0.9 s/co ratio  HIV antibody  Result Value Ref Range   HIV Screen 4th Generation wRfx NON REACTIVE Non Reactive  Miscellaneous [facility] test (send-out)  Result Value Ref Range   [facility] test code [id number]    [facility] test name HEP A IGM    Misc [facility] result COMMENT   Hepatitis A antibody, IgM  Result Value Ref Range   Hep A IgM Negative Negative      Assessment & Plan:   Problem List Items Addressed This Visit    None    Visit Diagnoses    Itching    -  Primary    Possibly due to his crestor. Will stop it for 1 week, then will call him to see how he's doing and reevalute. Consider increase gabapentin or neurology referral        Follow up plan: Return if symptoms worsen or fail to improve.

## 2016-02-06 NOTE — Telephone Encounter (Signed)
appt scheduled

## 2016-02-07 ENCOUNTER — Ambulatory Visit: Payer: Managed Care, Other (non HMO) | Admitting: Family Medicine

## 2016-02-09 ENCOUNTER — Encounter: Payer: Self-pay | Admitting: Family Medicine

## 2016-02-10 MED ORDER — LIDOCAINE 5 % EX OINT: 1.0000 "application " | TOPICAL_OINTMENT | CUTANEOUS | Status: DC | PRN

## 2016-02-10 NOTE — Telephone Encounter (Signed)
Rx sent to his pharmacy

## 2016-02-13 ENCOUNTER — Encounter: Payer: Self-pay | Admitting: Family Medicine

## 2016-02-17 ENCOUNTER — Encounter: Payer: Self-pay | Admitting: Family Medicine

## 2016-02-17 DIAGNOSIS — L299 Pruritus, unspecified: Secondary | ICD-10-CM

## 2016-02-17 NOTE — Telephone Encounter (Signed)
Referral to neurology generated today.

## 2016-02-18 ENCOUNTER — Encounter: Payer: Self-pay | Admitting: *Deleted

## 2016-02-18 ENCOUNTER — Other Ambulatory Visit: Payer: Self-pay | Admitting: *Deleted

## 2016-02-18 ENCOUNTER — Encounter: Payer: Self-pay | Admitting: Neurology

## 2016-02-18 ENCOUNTER — Ambulatory Visit (INDEPENDENT_AMBULATORY_CARE_PROVIDER_SITE_OTHER): Payer: Managed Care, Other (non HMO) | Admitting: Neurology

## 2016-02-18 VITALS — BP 134/86 | HR 71 | Ht 69.5 in | Wt 193.0 lb

## 2016-02-18 DIAGNOSIS — R202 Paresthesia of skin: Secondary | ICD-10-CM | POA: Diagnosis not present

## 2016-02-18 MED ORDER — PREGABALIN 50 MG PO CAPS: 50.0000 mg | ORAL_CAPSULE | Freq: Three times a day (TID) | ORAL | Status: DC

## 2016-02-18 MED FILL — LYRICA 50 MG CAPSULE: 50 | 30 days supply | Qty: 90 | Fill #0 | Status: TO

## 2016-02-18 NOTE — Progress Notes (Signed)
PATIENT: Aaron Watson DOB: 12/23/1960  Chief Complaint  Patient presents with  . Pruritis    "Rusty" developed a rash four months ago.  Says the rash covers his whole body and itches. He also has prickly sensations.  He has been evaluated by PCP, dermatology and an allergist. He was found to only be allergic to dust mites and mold spores.  He has made environmental changes in his home - no curtains, no carpet, new mattress. He is currently using antihistamines and lidocaine cream to help with his symptoms.     HISTORICAL  Aaron Watson is a 55 years old right-handed male, seen in refer by  his primary care physician Dr. Steele SizerMark A Crissman in February 18 2016 for evaluation of rash and itching all over the body  Since Nov 2016, He noticed right elbow area itching, break off of small rash, over 2 weeks period of time, the rash and itchiness spread to his chest, arm, legs,  Over the past few months, he was evaluated by dermatologist, allergist, tried different antihistamine with only temporary improvement, he has constant itching sensation all over his body, even his skull, he denies loss of sensory or weakness.  Laboratory evaluation in January 2017, normal CMP creatinine 1.0 2 normal CBC hemoglobin 15 point 0 normal TSH low vitamin D 16.4, negative HIV, hepatitis ABC panel, ion panel  REVIEW OF SYSTEMS: Full 14 system review of systems performed and notable only for weight gain, fatigue, palpitation, rash, itching, snoring, skin sensitivity, not enough sleep, change in appetite, sleepiness, snoring, restless leg  ALLERGIES: No Known Allergies  HOME MEDICATIONS: Current Outpatient Prescriptions  Medication Sig Dispense Refill  . aspirin 81 MG tablet Take 81 mg by mouth daily.    . B Complex-C (SUPER B COMPLEX PO) Take by mouth.    . co-enzyme Q-10 30 MG capsule Take 30 mg by mouth 3 (three) times daily.    . cyproheptadine (PERIACTIN) 4 MG tablet Take 4 mg by mouth at bedtime.     . flecainide (TAMBOCOR) 150 MG tablet Take 1 tablet (150 mg total) by mouth 2 (two) times daily. 180 tablet 3  . GABAPENTIN PO Take 500 mg by mouth daily.    . hydrOXYzine (ATARAX/VISTARIL) 25 MG tablet TK 1 T PO QD IN THE EVE PRN  5  . levocetirizine (XYZAL) 5 MG tablet TK 1 T PO QD UTD  5  . lidocaine (XYLOCAINE) 5 % ointment Apply 1 application topically as needed. 35.44 g 0  . omega-3 acid ethyl esters (LOVAZA) 1 g capsule Take 1 g by mouth daily.    Marland Kitchen. OVER THE COUNTER MEDICATION Super Digestive Enzyme    . ranitidine (ZANTAC) 150 MG tablet Take 150 mg by mouth 2 (two) times daily.    . rosuvastatin (CRESTOR) 10 MG tablet Take 1 tablet (10 mg total) by mouth daily. 90 tablet 3  . valACYclovir (VALTREX) 500 MG tablet Take 1 tablet (500 mg total) by mouth 3 (three) times daily. 90 tablet 12  . Vitamin D, Ergocalciferol, (DRISDOL) 50000 units CAPS capsule Take 1 capsule (50,000 Units total) by mouth every 7 (seven) days. 8 capsule 0  . zafirlukast (ACCOLATE) 20 MG tablet TK 1 T PO BID  0   No current facility-administered medications for this visit.    PAST MEDICAL HISTORY: Past Medical History  Diagnosis Date  . OSA on CPAP   . Hyperlipidemia with target LDL less than 100   .  Coronary artery disease, non-occlusive 03/24/2007    Cardiac cath at [facility]: No obstructive coronary artery disease with 40 % mid LAD stenosis  . Hyperlipidemia   . Paroxysmal atrial fibrillation [facility]) April 2008    Has been maintained in sinus rhythm on flecainide  . Restless leg syndrome   . Depression   . Anxiety   . GERD (gastroesophageal reflux disease)   . Arthritis     osteoarthritis  . Sleep apnea   . Itching     PAST SURGICAL HISTORY: Past Surgical History  Procedure Laterality Date  . Shoulder surgery Left   . Cardiac catheterization  2008    [facility]: 40% mid LAD lesion. Otherwise insignificant/minimal CAD.  . Transthoracic echocardiogram  July 20 13th; April 2016    At [facility]: a) normal LV  function. Mild LA dilation, mild LVH and mild TR/MR. b) Normal LV size and function. EF 55-60%. No RW MA. Trivial MR and TR. Otherwise normal  . Nm myoview ltd  April 2008    Dr. Welton Flakes ([facility]) : Inferior wall defect, EF 55%. -- Suggested to the due to ischemia in RCA territory versus diaphragmatic attenuation normal EF.  Marland Kitchen Nm myoview ltd  2013    Treadmill Myoview [facility]) no evidence of ischemia or infarction. Normal EF.    FAMILY HISTORY: Family History  Problem Relation Age of Onset  . Hypertension Mother   . Neuropathy Father   . Hypertension Father   . Heart attack Maternal Grandfather     SOCIAL HISTORY:  Social History   Social History  . Marital Status: Married    Spouse Name: N/A  . Number of Children: N/A  . Years of Education: 14   Occupational History  . 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 Smoking status: Former Smoker -- 0.50 packs/day for 10 years    Types: Cigarettes  . Smokeless tobacco: Not on file     Comment: Quit 10+ years ago.  . Alcohol Use: 0.0 oz/week    0 Standard drinks or equivalent per week     Comment: Occasional  . Drug Use: No  . Sexual Activity: Not on file   Other Topics Concern  . 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   Moderate amount of daily caffeine use.     PHYSICAL EXAM   Filed Vitals:   02/18/16 0754  BP: 134/86  Pulse: 71  Height: 5' 9.5" (1.765 m)  Weight: 193 lb (87.544 kg)    Not recorded      Body mass index is 28.1 kg/(m^2).  PHYSICAL EXAMNIATION:  Gen: NAD, conversant, well nourised, obese, well groomed                     Cardiovascular: Regular rate rhythm, no peripheral edema, warm, nontender. Eyes: Conjunctivae clear without exudates or hemorrhage Neck: Supple, no carotid bruise. Pulmonary: Clear to auscultation bilaterally  Skin: Scattered  raised erythematous rash, with scratch mark   NEUROLOGICAL EXAM:  MENTAL STATUS: Speech:    Speech is normal; fluent and spontaneous with normal comprehension.  Cognition:     Orientation to time, place and person     Normal recent and remote memory     Normal Attention span and concentration     Normal Language, naming,  repeating,spontaneous speech     Fund of knowledge   CRANIAL NERVES: CN II: Visual fields are full to confrontation. Fundoscopic exam is normal with sharp discs and no vascular changes. Pupils are round equal and briskly reactive to light. CN Watson, IV, VI: extraocular movement are normal. No ptosis. CN V: Facial sensation is intact to pinprick in all 3 divisions bilaterally. Corneal responses are intact.  CN VII: Face is symmetric with normal eye closure and smile. CN VIII: Hearing is normal to rubbing fingers CN IX, X: Palate elevates symmetrically. Phonation is normal. CN XI: Head turning and shoulder shrug are intact CN XII: Tongue is midline with normal movements and no atrophy.  MOTOR: There is no pronator drift of out-stretched arms. Muscle bulk and tone are normal. Muscle strength is normal.  REFLEXES: Reflexes are 2+ and symmetric at the biceps, triceps, knees, and ankles. Plantar responses are flexor.  SENSORY: Intact to light touch, pinprick, position sense, and vibration sense are intact in fingers and toes.  COORDINATION: Rapid alternating movements and fine finger movements are intact. There is no dysmetria on finger-to-nose and heel-knee-shin.    GAIT/STANCE: Posture is normal. Gait is steady with normal steps, base, arm swing, and turning. Heel and toe walking are normal. Tandem gait is normal.  Romberg is absent.   DIAGNOSTIC DATA (LABS, IMAGING, TESTING) - I reviewed patient records, labs, notes, testing and imaging myself where available.   ASSESSMENT AND PLAN  Aaron Watson is a 55 y.o. male   Pruritus  I am not sure is  neurological related  Laboratory evaluation to rule out inflammatory process  I have suggested him continue follow-up with dermatology potential skin biopsy   Levert Feinstein, M.D. Ph.D.  [facility] [address], [address] [place], [place] [address] Ph: [phone] Fax: [phone]  CC: Steele Sizer, MD

## 2016-02-19 LAB — B. BURGDORFI ANTIBODIES

## 2016-02-20 LAB — VITAMIN B12: Vitamin B-12: 606 pg/mL (ref 211–946)

## 2016-02-20 LAB — COPPER, SERUM: COPPER: 82 ug/dL (ref 72–166)

## 2016-02-20 LAB — C-REACTIVE PROTEIN: CRP: <0.3 mg/L (ref 0.0–4.9)

## 2016-02-20 LAB — SEDIMENTATION RATE: Sed Rate: 2 mm/hr (ref 0–30)

## 2016-02-20 LAB — ANA W/REFLEX IF POSITIVE: Anti Nuclear Antibody(ANA): NEGATIVE

## 2016-02-26 ENCOUNTER — Encounter: Payer: Self-pay | Admitting: Family Medicine

## 2016-03-06 MED FILL — SERTRALINE HCL 100 MG TAB: 100 | 30 days supply | Qty: 30 | Fill #3

## 2016-03-09 MED FILL — VALACYCLOVIR HCL 500 MG TAB: 500 | 30 days supply | Qty: 90 | Fill #1 | Status: TO

## 2016-04-07 ENCOUNTER — Ambulatory Visit (INDEPENDENT_AMBULATORY_CARE_PROVIDER_SITE_OTHER): Payer: Managed Care, Other (non HMO) | Admitting: Cardiovascular Disease

## 2016-04-07 ENCOUNTER — Encounter: Payer: Self-pay | Admitting: Cardiovascular Disease

## 2016-04-07 VITALS — BP 140/76 | HR 72 | Ht 72.0 in | Wt 195.5 lb

## 2016-04-07 DIAGNOSIS — I480 Paroxysmal atrial fibrillation: Secondary | ICD-10-CM

## 2016-04-07 NOTE — Patient Instructions (Signed)
Medication Instructions: Continue same medications.   Labwork: None.   Procedures/Testing: None.   Follow-Up: 1 year with Dr. Shourya Macpherson.   Any Additional Special Instructions Will Be Listed Below (If Applicable).     If you need a refill on your cardiac medications before your next appointment, please call your pharmacy.   

## 2016-04-07 NOTE — Progress Notes (Signed)
Cardiology Office Note   Date:  04/07/2016   ID:  Aaron Watson, DOB December 07, 1961, MRN [id number]  PCP:  Vonita Moss, MD  Cardiologist:   Lorine Bears, MD   Chief Complaint  Patient presents with  . other    1 yr f/u. Meds reveiwed verbally with pt.      History of Present Illness: Aaron Watson is a 55 y.o. male who presents for a followup visit regarding persistent atrial fibrillation maintained in sinus rhythm with flecainide. He was diagnosed with  atrial fibrillation in 2008. He has been maintaining in sinus rhythm on flecainide since then. He had cardiac catheterization done in 2008 which showed no evidence of obstructive coronary artery disease. There was a 40% stenosis in the mid LAD.  He had a treadmill nuclear stress test in 2013, and which showed no evidence of ischemia with normal ejection fraction. He does have known history of sleep apnea on CPAP.  Most recent echocardiogram in April 2016 showed normal LV systolic function, trivial mitral regurgitation and no evidence of pulmonary hypertension. Atrial size was normal. He is doing very well at this time. 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 He denies any chest pain, dyspnea or palpitations. He wanted to decrease the dose of flecainide last year and the dose was decreased to 50 mg twice daily. However, he quickly developed tachycardia and palpitations suggestive of atrial fibrillation and went back on the 150 mg dose twice daily. Since then, he has no recurrent palpitations. His blood pressure is mildly elevated today but the last 2 readings were within the normal limits.    Past Medical History  Diagnosis Date  . OSA on CPAP   . Hyperlipidemia with target LDL less than 100   . Coronary artery disease, non-occlusive 03/24/2007    Cardiac cath at [facility]: No obstructive coronary artery disease with 40 % mid LAD stenosis  . Hyperlipidemia   . Paroxysmal atrial  fibrillation [facility]) April 2008    Has been maintained in sinus rhythm on flecainide  . Restless leg syndrome   . Depression   . Anxiety   . GERD (gastroesophageal reflux disease)   . Arthritis     osteoarthritis  . Sleep apnea   . Itching     Past Surgical History  Procedure Laterality Date  . Shoulder surgery Left   . Cardiac catheterization  2008    [facility]: 40% mid LAD lesion. Otherwise insignificant/minimal CAD.  . Transthoracic echocardiogram  July 20 13th; April 2016    At [facility]: a) normal LV function. Mild LA dilation, mild LVH and mild TR/MR. b) Normal LV size and function. EF 55-60%. No RW MA. Trivial MR and TR. Otherwise normal  . Nm myoview ltd  April 2008    Dr. Welton Flakes ([facility]) : Inferior wall defect, EF 55%. -- Suggested to the due to ischemia in RCA territory versus diaphragmatic attenuation normal EF.  Marland Kitchen Nm myoview ltd  2013    Treadmill Myoview [facility]) no evidence of ischemia or infarction. Normal EF.     Current Outpatient Prescriptions  Medication Sig Dispense Refill  . aspirin 81 MG tablet Take 81 mg by mouth daily.    . B Complex-C (SUPER B COMPLEX PO) Take by mouth.    . co-enzyme Q-10 30 MG capsule Take 30 mg by mouth 3 (three) times daily.    . cyproheptadine (PERIACTIN) 4 MG tablet Take 4  mg by mouth at bedtime.    . flecainide (TAMBOCOR) 150 MG tablet Take 1 tablet (150 mg total) by mouth 2 (two) times daily. 180 tablet 3  . hydrOXYzine (ATARAX/VISTARIL) 25 MG tablet TK 1 T PO QD IN THE EVE PRN  5  . levocetirizine (XYZAL) 5 MG tablet TK 1 T PO QD UTD  5  . lidocaine (XYLOCAINE) 5 % ointment Apply 1 application topically as needed. 35.44 g 0  . omega-3 acid ethyl esters (LOVAZA) 1 g capsule Take 1 g by mouth daily.    Marland Kitchen OVER THE COUNTER MEDICATION Super Digestive Enzyme    . pregabalin (LYRICA) 50 MG capsule Take 1 capsule (50 mg total) by mouth 3 (three) times daily. 90 capsule 6  . ranitidine (ZANTAC) 150 MG tablet Take 150 mg by mouth 2 (two)  times daily.    . rosuvastatin (CRESTOR) 10 MG tablet Take 1 tablet (10 mg total) by mouth daily. 90 tablet 3  . valACYclovir (VALTREX) 500 MG tablet Take 1 tablet (500 mg total) by mouth 3 (three) times daily. 90 tablet 12  . Vitamin D, Ergocalciferol, (DRISDOL) 50000 units CAPS capsule Take 1 capsule (50,000 Units total) by mouth every 7 (seven) days. 8 capsule 0  . zafirlukast (ACCOLATE) 20 MG tablet TK 1 T PO BID  0   No current facility-administered medications for this visit.    Allergies:   Review of patient's allergies indicates no known allergies.    Social History:  The patient  reports that he has quit smoking. His smoking use included Cigarettes. He has a 5 pack-year smoking history. He does not have any smokeless tobacco history on file. He reports that he drinks alcohol. He reports that he does not use illicit drugs.   Family History:  The patient's family history includes Heart attack in his maternal grandfather; Hypertension in his father and mother; Neuropathy in his father.    ROS:  Please see the history of present illness.   Otherwise, review of systems are positive for none.   All other systems are reviewed and negative.    PHYSICAL EXAM: VS:  There were no vitals taken for this visit. , BMI There is no weight on file to calculate BMI. GEN: Well nourished, well developed, in no acute distress HEENT: normal Neck: no JVD, carotid bruits, or masses Cardiac: RRR; no murmurs, rubs, or gallops,no edema  Respiratory:  clear to auscultation bilaterally, normal work of breathing GI: soft, nontender, nondistended, + BS MS: no deformity or atrophy Skin: warm and dry, no rash Neuro:  Strength and sensation are intact Psych: euthymic mood, full affect   EKG:  EKG is ordered today. The ekg ordered today demonstrates normal sinus rhythm with left anterior fascicular block. Normal QT and PR intervals.   Recent Labs: 01/13/2016: ALT 24; BUN 15; Creatinine, Ser 1.02;  Platelets 260; Potassium 3.9; Sodium 137; TSH 1.590    Lipid Panel    Component Value Date/Time   CHOL 190 04/17/2015 0708   TRIG 198* 04/17/2015 0708   HDL 31* 04/17/2015 0708   CHOLHDL 6.1 04/17/2015 0708   VLDL 40 04/17/2015 0708   LDLCALC 119* 04/17/2015 0708      Wt Readings from Last 3 Encounters:  02/18/16 193 lb (87.544 kg)  02/06/16 193 lb (87.544 kg)  01/13/16 190 lb (86.183 kg)        ASSESSMENT AND PLAN:  1.  Persistent atrial fibrillation: Currently maintaining in sinus rhythm with high-dose flecainide with  no significant arrhythmia.CAHDs VASc score is 0.  I discussed with him the option of catheter ablation to eliminate the need for antiarrhythmic medication. He wants to think about this before proceeding.  2. Sleep apnea : He uses a CPAP machine on a daily basis.  3. Coronary artery disease involving native coronary artery without angina: He had 40% LAD stenosis on previous cardiac catheterization with no anginal symptoms. Continue medical therapy.  4. Hyperlipidemia: Continue treatment with rosuvastatin with a target LDL of less than 100 and preferably less than 70. He is going for a physical in the near future. The dose of rosuvastatin can be increased if needed.   Disposition:   FU with me in 1 year  Signed,  Lorine BearsMuhammad Arida, MD  04/07/2016 4:05 PM    [facility]

## 2016-04-13 MED FILL — SERTRALINE HCL 100 MG TAB: 100 | 30 days supply | Qty: 30 | Fill #4

## 2016-04-20 ENCOUNTER — Other Ambulatory Visit: Payer: Self-pay | Admitting: Family Medicine

## 2016-04-20 NOTE — Telephone Encounter (Signed)
Needs to come in for blood work 

## 2016-04-20 NOTE — Telephone Encounter (Signed)
Your patient 

## 2016-04-23 ENCOUNTER — Telehealth: Payer: Self-pay | Admitting: Family Medicine

## 2016-04-23 DIAGNOSIS — Z0000 Encounter for general adult medical examination without abnormal findings: Secondary | ICD-10-CM

## 2016-04-23 NOTE — Telephone Encounter (Signed)
Pt would like to know if his orders could be entered and collected tomorrow for his cpe on Monday 04/27/16. I explained that things are done a little different and labs are done at the time of the cpe now but I told him I would ask and would call him back and let him know.

## 2016-04-23 NOTE — Telephone Encounter (Signed)
Expand All Collapse All   Pt would like to know if his orders could be entered and collected tomorrow for his cpe on Monday 04/27/16. I explained that things are done a little different and labs are done at the time of the cpe now but I told him I would ask and would call him back and let him know.        Ok by me  ???

## 2016-04-24 ENCOUNTER — Other Ambulatory Visit: Payer: Managed Care, Other (non HMO)

## 2016-04-24 DIAGNOSIS — Z0000 Encounter for general adult medical examination without abnormal findings: Secondary | ICD-10-CM

## 2016-04-24 LAB — URINALYSIS, ROUTINE W REFLEX MICROSCOPIC
BILIRUBIN UA: NEGATIVE
Glucose, UA: NEGATIVE
KETONES UA: NEGATIVE
LEUKOCYTES UA: NEGATIVE
Nitrite, UA: NEGATIVE
PROTEIN UA: NEGATIVE
SPEC GRAV UA: 1.015 (ref 1.005–1.030)
Urobilinogen, Ur: 0.2 mg/dL (ref 0.2–1.0)
pH, UA: 7 (ref 5.0–7.5)

## 2016-04-24 LAB — MICROSCOPIC EXAMINATION

## 2016-04-25 LAB — CBC WITH DIFFERENTIAL/PLATELET
BASOS ABS: 0 10*3/uL (ref 0.0–0.2)
Basos: 1 %
EOS (ABSOLUTE): 0.1 10*3/uL (ref 0.0–0.4)
Eos: 1 %
Hematocrit: 47.4 % (ref 37.5–51.0)
Hemoglobin: 15.7 g/dL (ref 12.6–17.7)
Immature Grans (Abs): 0 10*3/uL (ref 0.0–0.1)
Immature Granulocytes: 0 %
LYMPHS ABS: 1.4 10*3/uL (ref 0.7–3.1)
Lymphs: 26 %
MCH: 30.8 pg (ref 26.6–33.0)
MCHC: 33.1 g/dL (ref 31.5–35.7)
MCV: 93 fL (ref 79–97)
Monocytes Absolute: 0.4 10*3/uL (ref 0.1–0.9)
Monocytes: 8 %
NEUTROS ABS: 3.6 10*3/uL (ref 1.4–7.0)
Neutrophils: 64 %
PLATELETS: 287 10*3/uL (ref 150–379)
RBC: 5.1 x10E6/uL (ref 4.14–5.80)
RDW: 13.9 % (ref 12.3–15.4)
WBC: 5.5 10*3/uL (ref 3.4–10.8)

## 2016-04-25 LAB — COMPREHENSIVE METABOLIC PANEL
ALK PHOS: 59 IU/L (ref 39–117)
ALT: 26 IU/L (ref 0–44)
AST: 20 IU/L (ref 0–40)
Albumin/Globulin Ratio: 2.2 (ref 1.2–2.2)
Albumin: 4.6 g/dL (ref 3.5–5.5)
BILIRUBIN TOTAL: 0.4 mg/dL (ref 0.0–1.2)
BUN / CREAT RATIO: 12 (ref 9–20)
BUN: 14 mg/dL (ref 6–24)
CHLORIDE: 98 mmol/L (ref 96–106)
CO2: 23 mmol/L (ref 18–29)
Calcium: 9.7 mg/dL (ref 8.7–10.2)
Creatinine, Ser: 1.15 mg/dL (ref 0.76–1.27)
GFR calc Af Amer: 84 mL/min/{1.73_m2} (ref >59)
GFR calc non Af Amer: 72 mL/min/{1.73_m2} (ref >59)
GLUCOSE: 90 mg/dL (ref 65–99)
Globulin, Total: 2.1 g/dL (ref 1.5–4.5)
Potassium: 4.7 mmol/L (ref 3.5–5.2)
Sodium: 138 mmol/L (ref 134–144)
Total Protein: 6.7 g/dL (ref 6.0–8.5)

## 2016-04-25 LAB — LIPID PANEL W/O CHOL/HDL RATIO
CHOLESTEROL TOTAL: 118 mg/dL (ref 100–199)
HDL: 33 mg/dL — AB (ref >39)
LDL Calculated: 44 mg/dL (ref 0–99)
TRIGLYCERIDES: 206 mg/dL — AB (ref 0–149)
VLDL CHOLESTEROL CAL: 41 mg/dL — AB (ref 5–40)

## 2016-04-25 LAB — TSH: TSH: 1.69 u[IU]/mL (ref 0.450–4.500)

## 2016-04-25 LAB — PSA: Prostate Specific Ag, Serum: 1.5 ng/mL (ref 0.0–4.0)

## 2016-04-27 ENCOUNTER — Encounter: Payer: Self-pay | Admitting: Family Medicine

## 2016-04-27 ENCOUNTER — Ambulatory Visit (INDEPENDENT_AMBULATORY_CARE_PROVIDER_SITE_OTHER): Payer: Managed Care, Other (non HMO) | Admitting: Family Medicine

## 2016-04-27 VITALS — BP 132/80 | HR 83 | Temp 98.0°F | Ht 70.0 in | Wt 192.0 lb

## 2016-04-27 DIAGNOSIS — I480 Paroxysmal atrial fibrillation: Secondary | ICD-10-CM | POA: Diagnosis not present

## 2016-04-27 DIAGNOSIS — Z23 Encounter for immunization: Secondary | ICD-10-CM

## 2016-04-27 DIAGNOSIS — Z9989 Dependence on other enabling machines and devices: Secondary | ICD-10-CM

## 2016-04-27 DIAGNOSIS — Z0000 Encounter for general adult medical examination without abnormal findings: Secondary | ICD-10-CM | POA: Diagnosis not present

## 2016-04-27 DIAGNOSIS — E785 Hyperlipidemia, unspecified: Secondary | ICD-10-CM

## 2016-04-27 DIAGNOSIS — F329 Major depressive disorder, single episode, unspecified: Secondary | ICD-10-CM

## 2016-04-27 DIAGNOSIS — G4733 Obstructive sleep apnea (adult) (pediatric): Secondary | ICD-10-CM | POA: Diagnosis not present

## 2016-04-27 DIAGNOSIS — F32A Depression, unspecified: Secondary | ICD-10-CM

## 2016-04-27 DIAGNOSIS — G2581 Restless legs syndrome: Secondary | ICD-10-CM | POA: Insufficient documentation

## 2016-04-27 MED ORDER — SERTRALINE HCL 100 MG PO TABS: 100.0000 mg | ORAL_TABLET | Freq: Every day | ORAL | Status: DC

## 2016-04-27 MED ORDER — VALACYCLOVIR HCL 500 MG PO TABS: 500.0000 mg | ORAL_TABLET | Freq: Every day | ORAL | Status: DC

## 2016-04-27 MED ORDER — GABAPENTIN 100 MG PO CAPS: 100.0000 mg | ORAL_CAPSULE | Freq: Two times a day (BID) | ORAL | Status: DC | PRN

## 2016-04-27 MED ORDER — ROSUVASTATIN CALCIUM 10 MG PO TABS: 10.0000 mg | ORAL_TABLET | Freq: Every day | ORAL | Status: DC

## 2016-04-27 NOTE — Progress Notes (Signed)
BP 132/80 mmHg  Pulse 83  Temp(Src) 98 F (36.7 C)  Ht  (1.778 m)  Wt 192 lb (87.091 kg)  BMI 27.55 kg/m2  SpO2 98%   Subjective:    Patient ID: Aaron Watson, male    DOB: 1961/03/27, 55 y.o.   MRN: [id number]  HPI: Aaron Watson is a 55 y.o. male  Chief Complaint  Patient presents with  . Annual Exam  Patient doing well with intermittent atrial fib taking medications without problems followed by cardiology.  Discussed patient not taking gabapentin but stopped it as concerned may be causing some itching now restless legs or driving crazy is ready to start again.  Nerves doing okay got laid off from work is now job searching taking sertraline without problems  Cholesterol stable on Crestor without problems And Valtrex control and fever blisters  Relevant past medical, surgical, family and social history reviewed and updated as indicated. Interim medical history since our last visit reviewed. Allergies and medications reviewed and updated.  Review of Systems  Constitutional: Negative.   HENT: Negative.   Eyes: Negative.   Respiratory: Negative.   Cardiovascular: Negative.   Gastrointestinal: Negative.   Endocrine: Negative.   Genitourinary: Negative.   Musculoskeletal: Negative.   Skin: Negative.   Allergic/Immunologic: Negative.   Neurological: Negative.   Hematological: Negative.   Psychiatric/Behavioral: Negative.     Per HPI unless specifically indicated above     Objective:    BP 132/80 mmHg  Pulse 83  Temp(Src) 98 F (36.7 C)  Ht  (1.778 m)  Wt 192 lb (87.091 kg)  BMI 27.55 kg/m2  SpO2 98%  Wt Readings from Last 3 Encounters:  04/27/16 192 lb (87.091 kg)  04/07/16 195 lb 8 oz (88.678 kg)  02/18/16 193 lb (87.544 kg)    Physical Exam  Constitutional: He is oriented to person, place, and time. He appears well-developed and well-nourished.  HENT:  Head: Normocephalic and atraumatic.  Right Ear: External ear normal.   Left Ear: External ear normal.  Eyes: Conjunctivae and EOM are normal. Pupils are equal, round, and reactive to light.  Neck: Normal range of motion. Neck supple.  Cardiovascular: Normal rate, regular rhythm, normal heart sounds and intact distal pulses.   Pulmonary/Chest: Effort normal and breath sounds normal.  Abdominal: Soft. Bowel sounds are normal. There is no splenomegaly or hepatomegaly.  Genitourinary: Rectum normal, prostate normal and penis normal.  Musculoskeletal: Normal range of motion.  Neurological: He is alert and oriented to person, place, and time. He has normal reflexes.  Skin: No rash noted. No erythema.  Psychiatric: He has a normal mood and affect. His behavior is normal. Judgment and thought content normal.    Results for orders placed or performed in visit on 04/24/16  Microscopic Examination  Result Value Ref Range   WBC, UA 0-5 0 -  5 /hpf   RBC, UA 0-2 0 -  2 /hpf   Epithelial Cells (non renal) 0-10 0 - 10 /hpf   Bacteria, UA Few None seen/Few  CBC with Differential/Platelet  Result Value Ref Range   WBC 5.5 3.4 - 10.8 x10E3/uL   RBC 5.10 4.14 - 5.80 x10E6/uL   Hemoglobin 15.7 12.6 - 17.7 g/dL   Hematocrit 40.9 81.1 - 51.0 %   MCV 93 79 - 97 fL   MCH 30.8 26.6 - 33.0 pg   MCHC 33.1 31.5 - 35.7 g/dL   RDW 91.4 78.2 - 95.6 %  Platelets 287 150 - 379 x10E3/uL   Neutrophils 64 %   Lymphs 26 %   Monocytes 8 %   Eos 1 %   Basos 1 %   Neutrophils Absolute 3.6 1.4 - 7.0 x10E3/uL   Lymphocytes Absolute 1.4 0.7 - 3.1 x10E3/uL   Monocytes Absolute 0.4 0.1 - 0.9 x10E3/uL   EOS (ABSOLUTE) 0.1 0.0 - 0.4 x10E3/uL   Basophils Absolute 0.0 0.0 - 0.2 x10E3/uL   Immature Granulocytes 0 %   Immature Grans (Abs) 0.0 0.0 - 0.1 x10E3/uL  Comprehensive metabolic panel  Result Value Ref Range   Glucose 90 65 - 99 mg/dL   BUN 14 6 - 24 mg/dL   Creatinine, Ser 1.61 0.76 - 1.27 mg/dL   GFR calc non Af Amer 72 >59 mL/min/1.73   GFR calc Af Amer 84 >59 mL/min/1.73    BUN/Creatinine Ratio 12 9 - 20   Sodium 138 134 - 144 mmol/L   Potassium 4.7 3.5 - 5.2 mmol/L   Chloride 98 96 - 106 mmol/L   CO2 23 18 - 29 mmol/L   Calcium 9.7 8.7 - 10.2 mg/dL   Total Protein 6.7 6.0 - 8.5 g/dL   Albumin 4.6 3.5 - 5.5 g/dL   Globulin, Total 2.1 1.5 - 4.5 g/dL   Albumin/Globulin Ratio 2.2 1.2 - 2.2   Bilirubin Total 0.4 0.0 - 1.2 mg/dL   Alkaline Phosphatase 59 39 - 117 IU/L   AST 20 0 - 40 IU/L   ALT 26 0 - 44 IU/L  Lipid Panel w/o Chol/HDL Ratio  Result Value Ref Range   Cholesterol, Total 118 100 - 199 mg/dL   Triglycerides 096 (H) 0 - 149 mg/dL   HDL 33 (L) >04 mg/dL   VLDL Cholesterol Cal 41 (H) 5 - 40 mg/dL   LDL Calculated 44 0 - 99 mg/dL  PSA  Result Value Ref Range   Prostate Specific Ag, Serum 1.5 0.0 - 4.0 ng/mL  TSH  Result Value Ref Range   TSH 1.690 0.450 - 4.500 uIU/mL  Urinalysis, Routine w reflex microscopic (not at [facility])  Result Value Ref Range   Specific Gravity, UA 1.015 1.005 - 1.030   pH, UA 7.0 5.0 - 7.5   Color, UA Yellow Yellow   Appearance Ur Clear Clear   Leukocytes, UA Negative Negative   Protein, UA Negative Negative/Trace   Glucose, UA Negative Negative   Ketones, UA Negative Negative   RBC, UA 2+ (A) Negative   Bilirubin, UA Negative Negative   Urobilinogen, Ur 0.2 0.2 - 1.0 mg/dL   Nitrite, UA Negative Negative   Microscopic Examination See below:       Assessment & Plan:   Problem List Items Addressed This Visit      Cardiovascular and Mediastinum   Paroxysmal atrial fibrillation (HCC)    Stable and followed by cardiology      Relevant Medications   rosuvastatin (CRESTOR) 10 MG tablet     Respiratory   OSA on CPAP    The current medical regimen is effective;  continue present plan and medications.         Other   Hyperlipidemia    The current medical regimen is effective;  continue present plan and medications.       Relevant Medications   rosuvastatin (CRESTOR) 10 MG tablet   Restless leg  syndrome    Wants to get back on gabapentin to treat restless legs will be cautious about developing itching again if so  will stop and try other medications.      Depression   Relevant Medications   sertraline (ZOLOFT) 100 MG tablet    Other Visit Diagnoses    Immunization due    -  Primary    Relevant Orders    Tdap vaccine greater than or equal to 7yo IM (Completed)    PE (physical exam), annual            Follow up plan: Return in about 6 months (around 10/28/2016) for Lipid panel, ALT, AST.

## 2016-04-27 NOTE — Assessment & Plan Note (Signed)
The current medical regimen is effective;  continue present plan and medications.  

## 2016-04-27 NOTE — Patient Instructions (Signed)
Tdap Vaccine (Tetanus, Diphtheria and Pertussis): What You Need to Know 1. Why get vaccinated? Tetanus, diphtheria and pertussis are very serious diseases. Tdap vaccine can protect us from these diseases. And, Tdap vaccine given to pregnant women can protect newborn babies against pertussis. TETANUS (Lockjaw) is rare in the [place] today. It causes painful muscle tightening and stiffness, usually all over the body.  It can lead to tightening of muscles in the head and neck so you can't open your mouth, swallow, or sometimes even breathe. Tetanus kills about 1 out of 10 people who are infected even after receiving the best medical care. DIPHTHERIA is also rare in the [place] today. It can cause a thick coating to form in the back of the throat.  It can lead to breathing problems, heart failure, paralysis, and death. PERTUSSIS (Whooping Cough) causes severe coughing spells, which can cause difficulty breathing, vomiting and disturbed sleep.  It can also lead to weight loss, incontinence, and rib fractures. Up to 2 in 100 adolescents and 5 in 100 adults with pertussis are hospitalized or have complications, which could include pneumonia or death. These diseases are caused by bacteria. Diphtheria and pertussis are spread from person to person through secretions from coughing or sneezing. Tetanus enters the body through cuts, scratches, or wounds. Before vaccines, as many as 200,000 cases of diphtheria, 200,000 cases of pertussis, and hundreds of cases of tetanus, were reported in the [place] each year. Since vaccination began, reports of cases for tetanus and diphtheria have dropped by about 99% and for pertussis by about 80%. 2. Tdap vaccine Tdap vaccine can protect adolescents and adults from tetanus, diphtheria, and pertussis. One dose of Tdap is routinely given at age 11 or 12. People who did not get Tdap at that age should get it as soon as possible. Tdap is especially important  for healthcare professionals and anyone having close contact with a baby younger than 12 months. Pregnant women should get a dose of Tdap during every pregnancy, to protect the newborn from pertussis. Infants are most at risk for severe, life-threatening complications from pertussis. Another vaccine, called Td, protects against tetanus and diphtheria, but not pertussis. A Td booster should be given every 10 years. Tdap may be given as one of these boosters if you have never gotten Tdap before. Tdap may also be given after a severe cut or burn to prevent tetanus infection. Your doctor or the person giving you the vaccine can give you more information. Tdap may safely be given at the same time as other vaccines. 3. Some people should not get this vaccine  A person who has ever had a life-threatening allergic reaction after a previous dose of any diphtheria, tetanus or pertussis containing vaccine, OR has a severe allergy to any part of this vaccine, should not get Tdap vaccine. Tell the person giving the vaccine about any severe allergies.  Anyone who had coma or long repeated seizures within 7 days after a childhood dose of DTP or DTaP, or a previous dose of Tdap, should not get Tdap, unless a cause other than the vaccine was found. They can still get Td.  Talk to your doctor if you:  have seizures or another nervous system problem,  had severe pain or swelling after any vaccine containing diphtheria, tetanus or pertussis,  ever had a condition called Guillain-Barr Syndrome (GBS),  aren't feeling well on the day the shot is scheduled. 4. Risks With any medicine, including vaccines, there is   a chance of side effects. These are usually mild and go away on their own. Serious reactions are also possible but are rare. Most people who get Tdap vaccine do not have any problems with it. Mild problems following Tdap (Did not interfere with activities)  Pain where the shot was given (about 3 in 4  adolescents or 2 in 3 adults)  Redness or swelling where the shot was given (about 1 person in 5)  Mild fever of at least 100.4F (up to about 1 in 25 adolescents or 1 in 100 adults)  Headache (about 3 or 4 people in 10)  Tiredness (about 1 person in 3 or 4)  Nausea, vomiting, diarrhea, stomach ache (up to 1 in 4 adolescents or 1 in 10 adults)  Chills, sore joints (about 1 person in 10)  Body aches (about 1 person in 3 or 4)  Rash, swollen glands (uncommon) Moderate problems following Tdap (Interfered with activities, but did not require medical attention)  Pain where the shot was given (up to 1 in 5 or 6)  Redness or swelling where the shot was given (up to about 1 in 16 adolescents or 1 in 12 adults)  Fever over 102F (about 1 in 100 adolescents or 1 in 250 adults)  Headache (about 1 in 7 adolescents or 1 in 10 adults)  Nausea, vomiting, diarrhea, stomach ache (up to 1 or 3 people in 100)  Swelling of the entire arm where the shot was given (up to about 1 in 500). Severe problems following Tdap (Unable to perform usual activities; required medical attention)  Swelling, severe pain, bleeding and redness in the arm where the shot was given (rare). Problems that could happen after any vaccine:  People sometimes faint after a medical procedure, including vaccination. Sitting or lying down for about 15 minutes can help prevent fainting, and injuries caused by a fall. Tell your doctor if you feel dizzy, or have vision changes or ringing in the ears.  Some people get severe pain in the shoulder and have difficulty moving the arm where a shot was given. This happens very rarely.  Any medication can cause a severe allergic reaction. Such reactions from a vaccine are very rare, estimated at fewer than 1 in a million doses, and would happen within a few minutes to a few hours after the vaccination. As with any medicine, there is a very remote chance of a vaccine causing a serious  injury or death. The safety of vaccines is always being monitored. For more information, visit: www.cdc.gov/vaccinesafety/ 5. What if there is a serious problem? What should I look for?  Look for anything that concerns you, such as signs of a severe allergic reaction, very high fever, or unusual behavior.  Signs of a severe allergic reaction can include hives, swelling of the face and throat, difficulty breathing, a fast heartbeat, dizziness, and weakness. These would usually start a few minutes to a few hours after the vaccination. What should I do?  If you think it is a severe allergic reaction or other emergency that can't wait, call 9-1-1 or get the person to the nearest hospital. Otherwise, call your doctor.  Afterward, the reaction should be reported to the Vaccine Adverse Event Reporting System (VAERS). Your doctor might file this report, or you can do it yourself through the VAERS web site at www.vaers.hhs.gov, or by calling 1-800-822-7967. VAERS does not give medical advice.  6. The National Vaccine Injury Compensation Program The National Vaccine Injury Compensation Program (  VICP) is a federal program that was created to compensate people who may have been injured by certain vaccines. Persons who believe they may have been injured by a vaccine can learn about the program and about filing a claim by calling 1-800-338-2382 or visiting the VICP website at www.hrsa.gov/vaccinecompensation. There is a time limit to file a claim for compensation. 7. How can I learn more?  Ask your doctor. He or she can give you the vaccine package insert or suggest other sources of information.  Call your local or state health department.  Contact the Centers for Disease Control and Prevention (CDC):  Call 1-800-232-4636 (1-800-CDC-INFO) or  Visit CDC's website at www.cdc.gov/vaccines CDC Tdap Vaccine VIS (02/06/14)   This information is not intended to replace advice given to you by your health care  provider. Make sure you discuss any questions you have with your health care provider.   Document Released: 05/31/2012 Document Revised: 12/21/2014 Document Reviewed: 03/14/2014 Elsevier Interactive Patient Education 2016 Elsevier Inc.  

## 2016-04-27 NOTE — Addendum Note (Signed)
Addended by: Lurlean HornsWILSON, NANCY H on: 04/27/2016 02:18 PM   Modules accepted: Kipp BroodSmartSet

## 2016-04-27 NOTE — Assessment & Plan Note (Signed)
Wants to get back on gabapentin to treat restless legs will be cautious about developing itching again if so will stop and try other medications.

## 2016-04-27 NOTE — Assessment & Plan Note (Signed)
Stable and followed by cardiology.

## 2016-05-14 ENCOUNTER — Encounter: Payer: Self-pay | Admitting: Family Medicine

## 2016-05-27 ENCOUNTER — Telehealth: Payer: Self-pay | Admitting: Cardiovascular Disease

## 2016-05-27 MED ORDER — FLECAINIDE ACETATE 150 MG PO TABS: 150.0000 mg | ORAL_TABLET | Freq: Two times a day (BID) | ORAL | Status: DC

## 2016-05-27 NOTE — Telephone Encounter (Signed)
called to inform pt we do not have samples of Flecainide and that he can get coupons from the web site [employer], LVM for him to call back.

## 2016-05-27 NOTE — Telephone Encounter (Signed)
   Patient is between jobs and insurance hasn't taken effect yet and he needs samples of flecainide due to the expense. Please call patient.

## 2016-05-27 NOTE — Telephone Encounter (Signed)
[facility], I sent a RX in for him for 90 pills, at his request.

## 2016-09-11 ENCOUNTER — Other Ambulatory Visit: Payer: Self-pay | Admitting: Family Medicine

## 2016-09-11 ENCOUNTER — Encounter: Payer: Self-pay | Admitting: Family Medicine

## 2016-09-11 ENCOUNTER — Telehealth: Payer: Self-pay

## 2016-09-11 MED ORDER — TADALAFIL 2.5 MG PO TABS: 5.0000 mg | ORAL_TABLET | Freq: Every day | ORAL | 1 refills | Status: DC

## 2016-09-11 MED ORDER — TADALAFIL 5 MG PO TABS: 5.0000 mg | ORAL_TABLET | Freq: Every day | ORAL | 12 refills | Status: DC

## 2016-09-11 NOTE — Telephone Encounter (Signed)
Patient is requesting through MyChart for an RX  Cialis 2.5  #150  He wants to pick the Rx up

## 2016-09-11 NOTE — Telephone Encounter (Signed)
Patient requested to be forwarded to Dr. Laural BenesJohnson

## 2016-10-29 ENCOUNTER — Ambulatory Visit: Payer: Self-pay | Admitting: Family Medicine

## 2016-11-02 ENCOUNTER — Ambulatory Visit (INDEPENDENT_AMBULATORY_CARE_PROVIDER_SITE_OTHER): Payer: BLUE CROSS/BLUE SHIELD | Admitting: Family Medicine

## 2016-11-02 ENCOUNTER — Other Ambulatory Visit: Payer: Self-pay | Admitting: Family Medicine

## 2016-11-02 ENCOUNTER — Encounter: Payer: Self-pay | Admitting: Family Medicine

## 2016-11-02 VITALS — BP 136/84 | HR 77 | Temp 97.9°F | Wt 198.0 lb

## 2016-11-02 DIAGNOSIS — E782 Mixed hyperlipidemia: Secondary | ICD-10-CM

## 2016-11-02 DIAGNOSIS — Z9989 Dependence on other enabling machines and devices: Secondary | ICD-10-CM

## 2016-11-02 DIAGNOSIS — F3342 Major depressive disorder, recurrent, in full remission: Secondary | ICD-10-CM | POA: Diagnosis not present

## 2016-11-02 DIAGNOSIS — G4733 Obstructive sleep apnea (adult) (pediatric): Secondary | ICD-10-CM | POA: Diagnosis not present

## 2016-11-02 LAB — LP+ALT+AST PICCOLO, WAIVED
ALT (SGPT) Piccolo, Waived: 22 U/L (ref 10–47)
AST (SGOT) PICCOLO, WAIVED: 23 U/L (ref 11–38)

## 2016-11-02 MED ORDER — MELOXICAM 15 MG PO TABS: 15.0000 mg | ORAL_TABLET | Freq: Every day | ORAL | 3 refills | Status: DC

## 2016-11-02 NOTE — Progress Notes (Signed)
BP 136/84   Pulse 77   Temp 97.9 F (36.6 C)   Wt 198 lb (89.8 kg)   SpO2 97%   BMI 28.41 kg/m    Subjective:    Patient ID: Aaron Watson, male    DOB: 03/07/1961, 55 y.o.   MRN: [id number]  HPI: Aaron Watson is a 55 y.o. male  Chief Complaint  Patient presents with  . Hyperlipidemia  Patient upset with me has getting blood work after had Thanksgiving lunch at work. Otherwise is been doing well with no issues. Taking Crestor and sertraline with no problems taking faithfully.  Relevant past medical, surgical, family and social history reviewed and updated as indicated. Interim medical history since our last visit reviewed. Allergies and medications reviewed and updated.  Review of Systems  Constitutional: Negative.   Respiratory: Negative.   Cardiovascular: Negative.     Per HPI unless specifically indicated above     Objective:    BP 136/84   Pulse 77   Temp 97.9 F (36.6 C)   Wt 198 lb (89.8 kg)   SpO2 97%   BMI 28.41 kg/m   Wt Readings from Last 3 Encounters:  11/02/16 198 lb (89.8 kg)  04/27/16 192 lb (87.1 kg)  04/07/16 195 lb 8 oz (88.7 kg)    Physical Exam  Constitutional: He is oriented to person, place, and time. He appears well-developed and well-nourished. No distress.  HENT:  Head: Normocephalic and atraumatic.  Right Ear: Hearing normal.  Left Ear: Hearing normal.  Nose: Nose normal.  Eyes: Conjunctivae and lids are normal. Right eye exhibits no discharge. Left eye exhibits no discharge. No scleral icterus.  Cardiovascular: Normal rate, regular rhythm and normal heart sounds.   Pulmonary/Chest: Effort normal and breath sounds normal. No respiratory distress.  Musculoskeletal: Normal range of motion.  Neurological: He is alert and oriented to person, place, and time.  Skin: Skin is intact. No rash noted.  Psychiatric: He has a normal mood and affect. His speech is normal and behavior is normal. Judgment and thought content normal.  Cognition and memory are normal.    Results for orders placed or performed in visit on 04/24/16  Microscopic Examination  Result Value Ref Range   WBC, UA 0-5 0 - 5 /hpf   RBC, UA 0-2 0 - 2 /hpf   Epithelial Cells (non renal) 0-10 0 - 10 /hpf   Bacteria, UA Few None seen/Few  CBC with Differential/Platelet  Result Value Ref Range   WBC 5.5 3.4 - 10.8 x10E3/uL   RBC 5.10 4.14 - 5.80 x10E6/uL   Hemoglobin 15.7 12.6 - 17.7 g/dL   Hematocrit 41.347.4 24.437.5 - 51.0 %   MCV 93 79 - 97 fL   MCH 30.8 26.6 - 33.0 pg   MCHC 33.1 31.5 - 35.7 g/dL   RDW 01.013.9 27.212.3 - 53.615.4 %   Platelets 287 150 - 379 x10E3/uL   Neutrophils 64 %   Lymphs 26 %   Monocytes 8 %   Eos 1 %   Basos 1 %   Neutrophils Absolute 3.6 1.4 - 7.0 x10E3/uL   Lymphocytes Absolute 1.4 0.7 - 3.1 x10E3/uL   Monocytes Absolute 0.4 0.1 - 0.9 x10E3/uL   EOS (ABSOLUTE) 0.1 0.0 - 0.4 x10E3/uL   Basophils Absolute 0.0 0.0 - 0.2 x10E3/uL   Immature Granulocytes 0 %   Immature Grans (Abs) 0.0 0.0 - 0.1 x10E3/uL  Comprehensive metabolic panel  Result Value Ref Range  Glucose 90 65 - 99 mg/dL   BUN 14 6 - 24 mg/dL   Creatinine, Ser 1.611.15 0.76 - 1.27 mg/dL   GFR calc non Af Amer 72 >59 mL/min/1.73   GFR calc Af Amer 84 >59 mL/min/1.73   BUN/Creatinine Ratio 12 9 - 20   Sodium 138 134 - 144 mmol/L   Potassium 4.7 3.5 - 5.2 mmol/L   Chloride 98 96 - 106 mmol/L   CO2 23 18 - 29 mmol/L   Calcium 9.7 8.7 - 10.2 mg/dL   Total Protein 6.7 6.0 - 8.5 g/dL   Albumin 4.6 3.5 - 5.5 g/dL   Globulin, Total 2.1 1.5 - 4.5 g/dL   Albumin/Globulin Ratio 2.2 1.2 - 2.2   Bilirubin Total 0.4 0.0 - 1.2 mg/dL   Alkaline Phosphatase 59 39 - 117 IU/L   AST 20 0 - 40 IU/L   ALT 26 0 - 44 IU/L  Lipid Panel w/o Chol/HDL Ratio  Result Value Ref Range   Cholesterol, Total 118 100 - 199 mg/dL   Triglycerides 096206 (H) 0 - 149 mg/dL   HDL 33 (L) >04>39 mg/dL   VLDL Cholesterol Cal 41 (H) 5 - 40 mg/dL   LDL Calculated 44 0 - 99 mg/dL  PSA  Result Value Ref  Range   Prostate Specific Ag, Serum 1.5 0.0 - 4.0 ng/mL  TSH  Result Value Ref Range   TSH 1.690 0.450 - 4.500 uIU/mL  Urinalysis, Routine w reflex microscopic (not at [facility])  Result Value Ref Range   Specific Gravity, UA 1.015 1.005 - 1.030   pH, UA 7.0 5.0 - 7.5   Color, UA Yellow Yellow   Appearance Ur Clear Clear   Leukocytes, UA Negative Negative   Protein, UA Negative Negative/Trace   Glucose, UA Negative Negative   Ketones, UA Negative Negative   RBC, UA 2+ (A) Negative   Bilirubin, UA Negative Negative   Urobilinogen, Ur 0.2 0.2 - 1.0 mg/dL   Nitrite, UA Negative Negative   Microscopic Examination See below:       Assessment & Plan:   Problem List Items Addressed This Visit      Respiratory   OSA on CPAP    The current medical regimen is effective;  continue present plan and medications.         Other   Hyperlipidemia - Primary    The current medical regimen is effective;  continue present plan and medications.       Relevant Orders   LP+ALT+AST Piccolo, Waived   Depression    The current medical regimen is effective;  continue present plan and medications.           Follow up plan: Return in about 6 months (around 05/02/2017) for Physical Exam.

## 2016-11-02 NOTE — Assessment & Plan Note (Signed)
The current medical regimen is effective;  continue present plan and medications.  

## 2016-11-03 ENCOUNTER — Encounter: Payer: Self-pay | Admitting: Family Medicine

## 2016-11-03 LAB — LIPID PANEL W/O CHOL/HDL RATIO
CHOLESTEROL TOTAL: 134 mg/dL (ref 100–199)
HDL: 27 mg/dL — ABNORMAL LOW (ref >39)
Triglycerides: 569 mg/dL (ref 0–149)

## 2017-03-11 ENCOUNTER — Telehealth: Payer: Self-pay | Admitting: Cardiovascular Disease

## 2017-03-11 NOTE — Telephone Encounter (Signed)
Pt w/hx of afib reports BP 103/62 HR 64 today. Has been feeling "jittery" for a couple of hours. BP normally 120s/70s. He drank 2 cups of coffee this morning; normally drinks 1. Drinking water "like crazy" trying to get BP up.  Heart rate does not feel irregular. Reviewed medications. He last took Cialis Sunday and has been taking [employer] daily. Informed pt possible side effect of this supplement could be reduced BP. States he will decrease dosage. Pt will continue to monitor BP and symptoms and will report back if not improved.  Pt agreeable w/plan.

## 2017-03-11 NOTE — Telephone Encounter (Signed)
Pt c/o BP issue: STAT if pt c/o blurred vision, one-sided weakness or slurred speech  1. What are your last 5 BP readings?  03/11/17 103/62  03/09/17 126/76 03/04/17 128/76 (estimated)   2. Are you having any other symptoms (ex. Dizziness, headache, blurred vision, passed out)?  Just a bit jittery   3. What is your BP issue?  Normally up around 125/75-78 Today it is about 103/62 and feels a bit jittery and has a history of Afib Does take flecainide

## 2017-04-12 ENCOUNTER — Other Ambulatory Visit: Payer: Self-pay

## 2017-04-12 ENCOUNTER — Ambulatory Visit (INDEPENDENT_AMBULATORY_CARE_PROVIDER_SITE_OTHER): Payer: BLUE CROSS/BLUE SHIELD | Admitting: Cardiovascular Disease

## 2017-04-12 ENCOUNTER — Encounter: Payer: Self-pay | Admitting: Cardiovascular Disease

## 2017-04-12 VITALS — BP 106/64 | HR 63 | Ht 71.5 in | Wt 179.8 lb

## 2017-04-12 DIAGNOSIS — I2510 Atherosclerotic heart disease of native coronary artery without angina pectoris: Secondary | ICD-10-CM

## 2017-04-12 DIAGNOSIS — I480 Paroxysmal atrial fibrillation: Secondary | ICD-10-CM

## 2017-04-12 DIAGNOSIS — E782 Mixed hyperlipidemia: Secondary | ICD-10-CM

## 2017-04-12 MED ORDER — FLECAINIDE ACETATE 150 MG PO TABS: 150.0000 mg | ORAL_TABLET | Freq: Two times a day (BID) | ORAL | 11 refills | Status: DC

## 2017-04-12 NOTE — Patient Instructions (Signed)
Medication Instructions: Continue same medications.   Labwork: None.   Procedures/Testing: None.   Follow-Up: 1 year with Dr. Arida.   Any Additional Special Instructions Will Be Listed Below (If Applicable).     If you need a refill on your cardiac medications before your next appointment, please call your pharmacy.   

## 2017-04-12 NOTE — Progress Notes (Signed)
Cardiology Office Note   Date:  04/12/2017   ID:  Aaron Watson, DOB 1961/03/23, MRN [id number]  PCP:  Vonita Moss, MD  Cardiologist:   Lorine Bears, MD   Chief Complaint  Patient presents with  . other    1 yr f/u no complaints today. Meds reviewed verbally with pt.      History of Present Illness: Aaron Watson is a 56 y.o. male who presents for a followup visit regarding persistent atrial fibrillation maintained in sinus rhythm with flecainide. He was diagnosed with  atrial fibrillation in 2008. He has been maintaining in sinus rhythm on flecainide since then. He had cardiac catheterization done in 2008 which showed no evidence of obstructive coronary artery disease. There was a 40% stenosis in the mid LAD.  He had a treadmill nuclear stress test in 2013, and which showed no evidence of ischemia with normal ejection fraction. He does have known history of sleep apnea on CPAP.  Most recent echocardiogram in April 2016 showed normal LV systolic function, trivial mitral regurgitation and no evidence of pulmonary hypertension. Atrial size was normal. He is doing very well at this time. 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  He was found to have severely elevated triglyceride last year. Since then, he improved his diet significantly and lost about 20 pounds. He is feeling very well and as a result, he was able to come off the CPAP with no recurrent symptoms. No chest pain or shortness of breath. Very rare palpitations are reported.   Past Medical History:  Diagnosis Date  . Anxiety   . Arthritis    osteoarthritis  . Depression   . GERD (gastroesophageal reflux disease)   . Hyperlipidemia   . Hyperlipidemia with target LDL less than 100   . Itching   . OSA on CPAP   . Restless leg syndrome   . Sleep apnea     Past Surgical History:  Procedure Laterality Date  . CARDIAC CATHETERIZATION  2008   [facility]: 40% mid LAD  lesion. Otherwise insignificant/minimal CAD.  Marland Kitchen NM MYOVIEW LTD  April 2008   Dr. Welton Flakes ([facility]) : Inferior wall defect, EF 55%. -- Suggested to the due to ischemia in RCA territory versus diaphragmatic attenuation normal EF.  Marland Kitchen NM MYOVIEW LTD  2013   Treadmill Myoview [facility]) no evidence of ischemia or infarction. Normal EF.  Marland Kitchen SHOULDER SURGERY Left   . TRANSTHORACIC ECHOCARDIOGRAM  July 20 13th; April 2016   At [facility]: a) normal LV function. Mild LA dilation, mild LVH and mild TR/MR. b) Normal LV size and function. EF 55-60%. No RW MA. Trivial MR and TR. Otherwise normal     Current Outpatient Prescriptions  Medication Sig Dispense Refill  . Ascorbic Acid (VITAMIN C PO) Take by mouth daily.    Marland Kitchen aspirin 81 MG tablet Take 81 mg by mouth daily.    . B Complex-C (SUPER B COMPLEX PO) Take by mouth daily.    . flecainide (TAMBOCOR) 150 MG tablet Take 1 tablet (150 mg total) by mouth 2 (two) times daily. 90 tablet 0  . L-ARGININE PO Take 5,100 mg by mouth daily.    . Misc Natural Products (PROSTATE SUPPORT PO) Take by mouth daily.    . Multiple Vitamin (MULTIVITAMIN) tablet Take 1 tablet by mouth daily.    . Pramipexole Dihydrochloride (MIRAPEX PO) Take by mouth at bedtime. Takes 3 tabs at  bedtime    . Probiotic Product (PROBIOTIC PO) Take by mouth daily.    . rosuvastatin (CRESTOR) 10 MG tablet Take 1 tablet (10 mg total) by mouth daily. 90 tablet 4  . sertraline (ZOLOFT) 100 MG tablet Take 1 tablet (100 mg total) by mouth daily. 90 tablet 4  . Tadalafil 2.5 MG TABS Take 2 tablets (5 mg total) by mouth daily. 150 tablet 1  . valACYclovir (VALTREX) 500 MG tablet Take 1 tablet (500 mg total) by mouth daily. 90 tablet 4   No current facility-administered medications for this visit.     Allergies:   Patient has no known allergies.    Social History:  The patient  reports that he has quit smoking. His smoking use included Cigarettes. He has a 5.00 pack-year smoking history. He has  never used smokeless tobacco. He reports that he drinks alcohol. He reports that he does not use drugs.   Family History:  The patient's family history includes Heart attack in his maternal grandfather; Hypertension in his father and mother; Neuropathy in his father.    ROS:  Please see the history of present illness.   Otherwise, review of systems are positive for none.   All other systems are reviewed and negative.    PHYSICAL EXAM: VS:  BP 106/64 (BP Location: Left Arm, Patient Position: Sitting, Cuff Size: Normal)   Pulse 63   Ht 5' 11.5" (1.816 m)   Wt 179 lb 12 oz (81.5 kg)   BMI 24.72 kg/m  , BMI Body mass index is 24.72 kg/m. GEN: Well nourished, well developed, in no acute distress  HEENT: normal  Neck: no JVD, carotid bruits, or masses Cardiac: RRR; no murmurs, rubs, or gallops,no edema  Respiratory:  clear to auscultation bilaterally, normal work of breathing GI: soft, nontender, nondistended, + BS MS: no deformity or atrophy  Skin: warm and dry, no rash Neuro:  Strength and sensation are intact Psych: euthymic mood, full affect   EKG:  EKG is ordered today. The ekg ordered today demonstrates normal sinus rhythm with left anterior fascicular block. Normal QT and PR intervals.   Recent Labs: 04/24/2016: BUN 14; Creatinine, Ser 1.15; Platelets 287; Potassium 4.7; Sodium 138; TSH 1.690 11/02/2016: ALT (SGPT) Piccolo, Waived 22    Lipid Panel    Component Value Date/Time   CHOL CANCELED 11/02/2016 1448   CHOL 134 11/02/2016 1448   TRIG CANCELED 11/02/2016 1448   TRIG 569 (HH) 11/02/2016 1448   HDL 27 (L) 11/02/2016 1448   CHOLHDL 6.1 04/17/2015 0708   VLDL 40 04/17/2015 0708   LDLCALC Comment 11/02/2016 1448      Wt Readings from Last 3 Encounters:  04/12/17 179 lb 12 oz (81.5 kg)  11/02/16 198 lb (89.8 kg)  04/27/16 192 lb (87.1 kg)        ASSESSMENT AND PLAN:  1.  Persistent atrial fibrillation: Currently maintaining in sinus rhythm with high-dose  flecainide with no significant arrhythmia.CAHDs VASc score is 0.  If he develops atrial fibrillation on flecainide, will proceed with ablation evaluation.  2. Sleep apnea : Symptoms have resolved after weight loss.  3. Coronary artery disease involving native coronary artery without angina: He had 40% LAD stenosis on previous cardiac catheterization with no anginal symptoms. Continue medical therapy.  4. Hyperlipidemia: His triglyceride was severely elevated but since then he has improved his diet and lost significant amount of weight. I suspect that this is improved now. He has a follow-up appointment with Dr.  Crissman next month to reevaluate this. He is hoping to be able to come off rosuvastatin. That might be reasonable if lipid profile looks optimal and he continues with healthy lifestyle. Marland Kitchen  Disposition:   FU with me in 1 year  Signed,  Lorine Bears, MD  04/12/2017 2:37 PM    [facility]

## 2017-04-28 ENCOUNTER — Encounter: Payer: BLUE CROSS/BLUE SHIELD | Admitting: Family Medicine

## 2017-04-29 ENCOUNTER — Encounter: Payer: BLUE CROSS/BLUE SHIELD | Admitting: Family Medicine

## 2017-05-07 ENCOUNTER — Telehealth: Payer: Self-pay | Admitting: Family Medicine

## 2017-05-07 NOTE — Telephone Encounter (Signed)
Dr Dossie Arbourrissman gave the patient a script with his labs written on it and he misplaced it.  His appt is 05-12-2017.  He would like to pick this up today if possible.  I explained Dr Dossie Arbourrissman is not in today but he is hoping someone can take a look at his chart and give him a new script.  Please advise.   Thanks  [phone]

## 2017-05-07 NOTE — Telephone Encounter (Signed)
Dr. Dossie Arbourrissman did not enter the labs he wanted. Advised patient that we are not sure what labs he wants and to wait until his appt on Wed and Dr.Crissman can write a new order.

## 2017-05-12 ENCOUNTER — Encounter: Payer: Self-pay | Admitting: Family Medicine

## 2017-05-12 ENCOUNTER — Ambulatory Visit (INDEPENDENT_AMBULATORY_CARE_PROVIDER_SITE_OTHER): Payer: BLUE CROSS/BLUE SHIELD | Admitting: Family Medicine

## 2017-05-12 ENCOUNTER — Telehealth: Payer: Self-pay | Admitting: Family Medicine

## 2017-05-12 VITALS — BP 124/78 | HR 88 | Ht 70.08 in | Wt 178.0 lb

## 2017-05-12 DIAGNOSIS — Z125 Encounter for screening for malignant neoplasm of prostate: Secondary | ICD-10-CM | POA: Diagnosis not present

## 2017-05-12 DIAGNOSIS — G2581 Restless legs syndrome: Secondary | ICD-10-CM | POA: Diagnosis not present

## 2017-05-12 DIAGNOSIS — Z1329 Encounter for screening for other suspected endocrine disorder: Secondary | ICD-10-CM | POA: Diagnosis not present

## 2017-05-12 DIAGNOSIS — Z0001 Encounter for general adult medical examination with abnormal findings: Secondary | ICD-10-CM | POA: Diagnosis not present

## 2017-05-12 DIAGNOSIS — H6123 Impacted cerumen, bilateral: Secondary | ICD-10-CM | POA: Diagnosis not present

## 2017-05-12 DIAGNOSIS — F3342 Major depressive disorder, recurrent, in full remission: Secondary | ICD-10-CM | POA: Diagnosis not present

## 2017-05-12 DIAGNOSIS — Z0000 Encounter for general adult medical examination without abnormal findings: Secondary | ICD-10-CM

## 2017-05-12 DIAGNOSIS — E782 Mixed hyperlipidemia: Secondary | ICD-10-CM | POA: Diagnosis not present

## 2017-05-12 DIAGNOSIS — Z9989 Dependence on other enabling machines and devices: Secondary | ICD-10-CM

## 2017-05-12 DIAGNOSIS — G4733 Obstructive sleep apnea (adult) (pediatric): Secondary | ICD-10-CM

## 2017-05-12 DIAGNOSIS — I480 Paroxysmal atrial fibrillation: Secondary | ICD-10-CM

## 2017-05-12 DIAGNOSIS — Z13220 Encounter for screening for lipoid disorders: Secondary | ICD-10-CM | POA: Diagnosis not present

## 2017-05-12 LAB — URINALYSIS, ROUTINE W REFLEX MICROSCOPIC
BILIRUBIN UA: NEGATIVE
GLUCOSE, UA: NEGATIVE
KETONES UA: NEGATIVE
Leukocytes, UA: NEGATIVE
Nitrite, UA: NEGATIVE
PH UA: 6 (ref 5.0–7.5)
Protein, UA: NEGATIVE
SPEC GRAV UA: 1.015 (ref 1.005–1.030)
UUROB: 0.2 mg/dL (ref 0.2–1.0)

## 2017-05-12 LAB — MICROSCOPIC EXAMINATION
Bacteria, UA: NONE SEEN
WBC UA: NONE SEEN /HPF (ref 0–?)

## 2017-05-12 NOTE — Telephone Encounter (Signed)
Patient wants to make sure that we remove [facility] and [facility] off his chart.  [facility] be [facility]  Thanks

## 2017-05-12 NOTE — Progress Notes (Addendum)
BP 124/78   Pulse 88   Ht 5' 10.08" (1.78 m)   Wt 178 lb (80.7 kg)   SpO2 97%   BMI 25.48 kg/m    Subjective:    Patient ID: Aaron Watson, male    DOB: Oct 13, 1961, 56 y.o.   MRN: [id number]  HPI: [name] is a 56 y.o. male  Chief Complaint  Patient presents with  . Annual Exam   The patient follow-up all in all doing well. Has been able to lose 20+ pounds over this last 6 months. Restless legs doing well with Mirapex. Cholesterols been doing well as wondered about stopping Crestor. Taking Zoloft 100 mg without problems and Valtrex without problems. Patient has lab work done including CMP which is normal lipids PSA. These are all normal no CBC or TSH done. Relevant past medical, surgical, family and social history reviewed and updated as indicated. Interim medical history since our last visit reviewed. Allergies and medications reviewed and updated.  Review of Systems  Constitutional: Negative.   HENT: Negative.   Eyes: Negative.   Respiratory: Negative.   Cardiovascular: Negative.   Gastrointestinal: Negative.   Endocrine: Negative.   Genitourinary: Negative.   Musculoskeletal: Negative.   Skin: Negative.   Allergic/Immunologic: Negative.   Neurological: Negative.   Hematological: Negative.   Psychiatric/Behavioral: Negative.     Per HPI unless specifically indicated above     Objective:    BP 124/78   Pulse 88   Ht 5' 10.08" (1.78 m)   Wt 178 lb (80.7 kg)   SpO2 97%   BMI 25.48 kg/m   Wt Readings from Last 3 Encounters:  05/12/17 178 lb (80.7 kg)  04/12/17 179 lb 12 oz (81.5 kg)  11/02/16 198 lb (89.8 kg)    Physical Exam  Constitutional: He is oriented to person, place, and time. He appears well-developed and well-nourished.  HENT:  Head: Normocephalic and atraumatic.  Right Ear: External ear normal.  Left Ear: External ear normal.  Ears blocked with cerumen removed with water and instruments revealing normal canals and TM Patient  with decreased hearing prior back to normal.  Eyes: Conjunctivae and EOM are normal. Pupils are equal, round, and reactive to light.  Neck: Normal range of motion. Neck supple.  Cardiovascular: Normal rate, regular rhythm, normal heart sounds and intact distal pulses.   Pulmonary/Chest: Effort normal and breath sounds normal.  Abdominal: Soft. Bowel sounds are normal. There is no splenomegaly or hepatomegaly.  Genitourinary: Rectum normal, prostate normal and penis normal.  Musculoskeletal: Normal range of motion.  Neurological: He is alert and oriented to person, place, and time. He has normal reflexes.  Skin: No rash noted. No erythema.  Psychiatric: He has a normal mood and affect. His behavior is normal. Judgment and thought content normal.    Results for orders placed or performed in visit on 11/02/16  Lipid Panel w/o Chol/HDL Ratio  Result Value Ref Range   Cholesterol, Total 134 100 - 199 mg/dL   Triglycerides 657 (HH) 0 - 149 mg/dL   HDL 27 (L) >84 mg/dL   VLDL Cholesterol Cal Comment 5 - 40 mg/dL   LDL Calculated Comment 0 - 99 mg/dL      Assessment & Plan:   Problem List Items Addressed This Visit      Cardiovascular and Mediastinum   Paroxysmal atrial fibrillation (HCC)    stable      Relevant Orders   CBC with Differential/Platelet   TSH  Respiratory   OSA on CPAP    With weight-loss not snoring or having interrupted breathing. Not using CPAP.        Other   Hyperlipidemia    Will try stopping Crestor to see how lipids too especially his great weight loss.      Relevant Orders   Urinalysis, Routine w reflex microscopic   Restless leg syndrome    The current medical regimen is effective;  continue present plan and medications.       Depression    The current medical regimen is effective;  continue present plan and medications.        Other Visit Diagnoses    PE (physical exam), annual    -  Primary   Relevant Orders   CBC with  Differential/Platelet   TSH   Screening cholesterol level       Prostate cancer screening       Thyroid disorder screen           Follow up plan: Return in about 6 months (around 11/12/2017) for med check.

## 2017-05-12 NOTE — Assessment & Plan Note (Addendum)
Will try stopping Crestor to see how lipids too especially his great weight loss.

## 2017-05-12 NOTE — Telephone Encounter (Signed)
Pharmacy list updated

## 2017-05-12 NOTE — Assessment & Plan Note (Signed)
The current medical regimen is effective;  continue present plan and medications.  

## 2017-05-12 NOTE — Assessment & Plan Note (Signed)
stable °

## 2017-05-12 NOTE — Assessment & Plan Note (Signed)
With weight-loss not snoring or having interrupted breathing. Not using CPAP.

## 2017-05-13 ENCOUNTER — Encounter: Payer: Self-pay | Admitting: Family Medicine

## 2017-05-13 LAB — CBC WITH DIFFERENTIAL/PLATELET
BASOS: 0 %
Basophils Absolute: 0 10*3/uL (ref 0.0–0.2)
EOS (ABSOLUTE): 0.1 10*3/uL (ref 0.0–0.4)
Eos: 1 %
Hematocrit: 42.3 % (ref 37.5–51.0)
Hemoglobin: 14.3 g/dL (ref 13.0–17.7)
IMMATURE GRANS (ABS): 0 10*3/uL (ref 0.0–0.1)
Immature Granulocytes: 0 %
LYMPHS: 25 %
Lymphocytes Absolute: 1.5 10*3/uL (ref 0.7–3.1)
MCH: 31.3 pg (ref 26.6–33.0)
MCHC: 33.8 g/dL (ref 31.5–35.7)
MCV: 93 fL (ref 79–97)
Monocytes Absolute: 0.3 10*3/uL (ref 0.1–0.9)
Monocytes: 5 %
NEUTROS ABS: 4.1 10*3/uL (ref 1.4–7.0)
Neutrophils: 69 %
PLATELETS: 266 10*3/uL (ref 150–379)
RBC: 4.57 x10E6/uL (ref 4.14–5.80)
RDW: 13.8 % (ref 12.3–15.4)
WBC: 6 10*3/uL (ref 3.4–10.8)

## 2017-05-13 LAB — TSH: TSH: 1.5 u[IU]/mL (ref 0.450–4.500)

## 2017-05-28 ENCOUNTER — Other Ambulatory Visit: Payer: Self-pay

## 2017-05-28 MED ORDER — FLECAINIDE ACETATE 150 MG PO TABS: 150.0000 mg | ORAL_TABLET | Freq: Two times a day (BID) | ORAL | 0 refills | Status: DC

## 2017-05-28 NOTE — Telephone Encounter (Signed)
Requested Prescriptions   Signed Prescriptions Disp Refills  . flecainide (TAMBOCOR) 150 MG tablet 60 tablet 0    Sig: Take 1 tablet (150 mg total) by mouth 2 (two) times daily.    Authorizing Provider: Lorine BearsARIDA, MUHAMMAD A    Ordering User: Margrett RudSLAYTON, Eliora Nienhuis N

## 2017-06-03 ENCOUNTER — Other Ambulatory Visit: Payer: Self-pay | Admitting: Family Medicine

## 2017-08-26 ENCOUNTER — Other Ambulatory Visit: Payer: Self-pay | Admitting: Family Medicine

## 2017-08-28 DIAGNOSIS — E785 Hyperlipidemia, unspecified: Secondary | ICD-10-CM | POA: Insufficient documentation

## 2017-08-28 DIAGNOSIS — E782 Mixed hyperlipidemia: Secondary | ICD-10-CM | POA: Insufficient documentation

## 2017-08-28 DIAGNOSIS — I480 Paroxysmal atrial fibrillation: Secondary | ICD-10-CM | POA: Insufficient documentation

## 2017-09-27 ENCOUNTER — Encounter: Payer: Self-pay | Admitting: Podiatry

## 2017-09-27 ENCOUNTER — Ambulatory Visit (INDEPENDENT_AMBULATORY_CARE_PROVIDER_SITE_OTHER): Payer: BLUE CROSS/BLUE SHIELD | Admitting: Podiatry

## 2017-09-27 ENCOUNTER — Ambulatory Visit (INDEPENDENT_AMBULATORY_CARE_PROVIDER_SITE_OTHER): Payer: BLUE CROSS/BLUE SHIELD

## 2017-09-27 VITALS — BP 138/87 | HR 68 | Resp 16

## 2017-09-27 DIAGNOSIS — M205X1 Other deformities of toe(s) (acquired), right foot: Secondary | ICD-10-CM | POA: Diagnosis not present

## 2017-09-27 MED ORDER — MELOXICAM 15 MG PO TABS: 15.0000 mg | ORAL_TABLET | Freq: Every day | ORAL | 3 refills | Status: DC

## 2017-09-27 MED ORDER — METHYLPREDNISOLONE 4 MG PO TBPK: ORAL_TABLET | ORAL | 0 refills | Status: DC

## 2017-09-27 NOTE — Progress Notes (Signed)
Subjective:  Patient ID: Aaron Watson, male    DOB: May 05, 1961,  MRN: [id number] HPI Chief Complaint  Patient presents with  . Foot Pain    1st MPJ right - aching initially in hallux and now has moved into the joint for few months, redness and swelling, callused area medial hallux, also notices feet roll outward    56 y.o. male presents with the above complaint.     Past Medical History:  Diagnosis Date  . Anxiety   . Arthritis    osteoarthritis  . Depression   . GERD (gastroesophageal reflux disease)   . Hyperlipidemia   . Hyperlipidemia with target LDL less than 100   . Itching   . OSA on CPAP   . Restless leg syndrome   . Sleep apnea    Past Surgical History:  Procedure Laterality Date  . CARDIAC CATHETERIZATION  2008   [facility]: 40% mid LAD lesion. Otherwise insignificant/minimal CAD.  Marland Kitchen NM MYOVIEW LTD  April 2008   Dr. Welton Flakes ([facility]) : Inferior wall defect, EF 55%. -- Suggested to the due to ischemia in RCA territory versus diaphragmatic attenuation normal EF.  Marland Kitchen NM MYOVIEW LTD  2013   Treadmill Myoview [facility]) no evidence of ischemia or infarction. Normal EF.  Marland Kitchen SHOULDER SURGERY Left   . TRANSTHORACIC ECHOCARDIOGRAM  July 20 13th; April 2016   At [facility]: a) normal LV function. Mild LA dilation, mild LVH and mild TR/MR. b) Normal LV size and function. EF 55-60%. No RW MA. Trivial MR and TR. Otherwise normal    Current Outpatient Prescriptions:  .  Ascorbic Acid (VITAMIN C PO), Take by mouth daily., Disp: , Rfl:  .  aspirin 81 MG tablet, Take 81 mg by mouth daily., Disp: , Rfl:  .  B Complex-C (SUPER B COMPLEX PO), Take by mouth daily., Disp: , Rfl:  .  flecainide (TAMBOCOR) 150 MG tablet, Take 1 tablet (150 mg total) by mouth 2 (two) times daily., Disp: 60 tablet, Rfl: 0 .  L-ARGININE PO, Take 5,100 mg by mouth daily., Disp: , Rfl:  .  Misc Natural Products (PROSTATE SUPPORT PO), Take by mouth daily., Disp: , Rfl:  .  Multiple Vitamin (MULTIVITAMIN)  tablet, Take 1 tablet by mouth daily., Disp: , Rfl:  .  Pramipexole Dihydrochloride (MIRAPEX PO), Take by mouth at bedtime. Takes 3 tabs at bedtime, Disp: , Rfl:  .  Probiotic Product (PROBIOTIC PO), Take by mouth daily., Disp: , Rfl:  .  sertraline (ZOLOFT) 100 MG tablet, TAKE 1 TABLET(100 MG) BY MOUTH DAILY, Disp: 90 tablet, Rfl: 0 .  Tadalafil 2.5 MG TABS, Take 2 tablets (5 mg total) by mouth daily., Disp: 150 tablet, Rfl: 1 .  valACYclovir (VALTREX) 500 MG tablet, Take 1 tablet (500 mg total) by mouth daily., Disp: 90 tablet, Rfl: 4  No Known Allergies Review of Systems  Cardiovascular: Positive for palpitations.  Gastrointestinal: Positive for abdominal distention.  Musculoskeletal: Positive for arthralgias, back pain and myalgias.  Psychiatric/Behavioral: Positive for behavioral problems.  All other systems reviewed and are negative.  Objective:   Vitals:   09/27/17 1415  BP: 138/87  Pulse: 68  Resp: 16    General: Well developed, nourished, in no acute distress, alert and oriented x3   Dermatological: Skin is warm, dry and supple bilateral. Nails x 10 are well maintained; remaining integument appears unremarkable at this time. There are no open sores, no preulcerative lesions, no rash or signs of infection present.  Vascular: Dorsalis Pedis artery and Posterior Tibial artery pedal pulses are 2/4 bilateral with immedate capillary fill time. Pedal hair growth present. No varicosities and no lower extremity edema present bilateral.   Neruologic: Grossly intact via light touch bilateral. Vibratory intact via tuning fork bilateral. Protective threshold with Semmes Wienstein monofilament intact to all pedal sites bilateral. Patellar and Achilles deep tendon reflexes 2+ bilateral. No Babinski or clonus noted bilateral.   Musculoskeletal: No gross boney pedal deformities bilateral. No pain, crepitus, or limitation noted with foot and ankle range of motion bilateral. Muscular strength  5/5 in all groups tested bilateral.Mild hallux valgus with limited range of motion first metatarsophalangeal joint. The joint is thicker and is to be more swollen than the left. Painful and in range of motion. The contralateral foot has a full range of motion of the first metatarsophalangeal joint.  Gait: Unassisted, Nonantalgic.    Radiographs:  3 views of the right foot taken today demonstrate an osseous mature individual with soft tissue swelling around the first metatarsophalangeal joint. There also appears to be a dorsal spur noted also a small fragment of bone within the joint which appears to be a broken spur from the proximal phalanx.  Assessment & Plan:   Assessment: Hallux limitus first metatarsophalangeal joint right foot.  Plan: Offered him injection he declined. We discussed etiology pathology inserted for surgical therapies. Placed in on Medrol Dosepak to be followed by meloxicam. Discussed appropriate shoe gear stretching exercise ice therapy shoe gear modification. We also discussed possible need for surgical intervention. He understands this and is amenable to it.     Chidera Dearcos T. [place], [place]

## 2017-10-07 ENCOUNTER — Encounter: Payer: Self-pay | Admitting: Family Medicine

## 2017-10-08 MED ORDER — TADALAFIL 2.5 MG PO TABS: 2.5000 mg | ORAL_TABLET | Freq: Every day | ORAL | 1 refills | Status: DC

## 2017-10-08 NOTE — Telephone Encounter (Signed)
2.5 mg daily tadalafil T'd up. Please advise.

## 2017-10-25 ENCOUNTER — Ambulatory Visit: Payer: BLUE CROSS/BLUE SHIELD | Admitting: Podiatry

## 2017-11-11 ENCOUNTER — Other Ambulatory Visit: Payer: Self-pay | Admitting: Neurology

## 2017-11-11 DIAGNOSIS — M542 Cervicalgia: Secondary | ICD-10-CM

## 2017-11-15 ENCOUNTER — Ambulatory Visit: Payer: BLUE CROSS/BLUE SHIELD | Admitting: Family Medicine

## 2017-11-19 ENCOUNTER — Ambulatory Visit
Admission: RE | Admit: 2017-11-19 | Discharge: 2017-11-19 | Disposition: A | Discharge status: Home / Self Care | Payer: BLUE CROSS/BLUE SHIELD | Source: Ambulatory Visit | Attending: Neurology | Admitting: Neurology

## 2017-11-19 DIAGNOSIS — M4802 Spinal stenosis, cervical region: Secondary | ICD-10-CM | POA: Insufficient documentation

## 2017-11-19 DIAGNOSIS — M542 Cervicalgia: Secondary | ICD-10-CM

## 2017-11-19 DIAGNOSIS — M50322 Other cervical disc degeneration at C5-C6 level: Secondary | ICD-10-CM | POA: Diagnosis not present

## 2017-11-19 DIAGNOSIS — M50223 Other cervical disc displacement at C6-C7 level: Secondary | ICD-10-CM | POA: Diagnosis not present

## 2017-11-30 ENCOUNTER — Other Ambulatory Visit: Payer: Self-pay | Admitting: Family Medicine

## 2017-11-30 ENCOUNTER — Ambulatory Visit: Payer: BLUE CROSS/BLUE SHIELD | Admitting: Student in an Organized Health Care Education/Training Program

## 2017-12-01 ENCOUNTER — Telehealth: Payer: Self-pay | Admitting: Family Medicine

## 2017-12-01 ENCOUNTER — Other Ambulatory Visit: Payer: Self-pay

## 2017-12-01 ENCOUNTER — Encounter: Payer: Self-pay | Admitting: Student in an Organized Health Care Education/Training Program

## 2017-12-01 ENCOUNTER — Ambulatory Visit (HOSPITAL_BASED_OUTPATIENT_CLINIC_OR_DEPARTMENT_OTHER): Payer: BLUE CROSS/BLUE SHIELD | Admitting: Student in an Organized Health Care Education/Training Program

## 2017-12-01 ENCOUNTER — Ambulatory Visit
Admission: RE | Admit: 2017-12-01 | Discharge: 2017-12-01 | Disposition: A | Discharge status: Home / Self Care | Payer: BLUE CROSS/BLUE SHIELD | Source: Ambulatory Visit | Attending: Student in an Organized Health Care Education/Training Program | Admitting: Student in an Organized Health Care Education/Training Program

## 2017-12-01 VITALS — BP 131/97 | HR 65 | Temp 98.2°F | Resp 18 | Ht 71.5 in | Wt 192.0 lb

## 2017-12-01 DIAGNOSIS — M1990 Unspecified osteoarthritis, unspecified site: Secondary | ICD-10-CM | POA: Diagnosis not present

## 2017-12-01 DIAGNOSIS — E079 Disorder of thyroid, unspecified: Secondary | ICD-10-CM | POA: Insufficient documentation

## 2017-12-01 DIAGNOSIS — M50223 Other cervical disc displacement at C6-C7 level: Secondary | ICD-10-CM | POA: Diagnosis not present

## 2017-12-01 DIAGNOSIS — G894 Chronic pain syndrome: Secondary | ICD-10-CM | POA: Diagnosis not present

## 2017-12-01 DIAGNOSIS — E785 Hyperlipidemia, unspecified: Secondary | ICD-10-CM | POA: Insufficient documentation

## 2017-12-01 DIAGNOSIS — G4733 Obstructive sleep apnea (adult) (pediatric): Secondary | ICD-10-CM | POA: Insufficient documentation

## 2017-12-01 DIAGNOSIS — K219 Gastro-esophageal reflux disease without esophagitis: Secondary | ICD-10-CM | POA: Diagnosis not present

## 2017-12-01 DIAGNOSIS — Z7982 Long term (current) use of aspirin: Secondary | ICD-10-CM | POA: Diagnosis not present

## 2017-12-01 DIAGNOSIS — Z79899 Other long term (current) drug therapy: Secondary | ICD-10-CM | POA: Insufficient documentation

## 2017-12-01 DIAGNOSIS — F419 Anxiety disorder, unspecified: Secondary | ICD-10-CM | POA: Insufficient documentation

## 2017-12-01 DIAGNOSIS — F329 Major depressive disorder, single episode, unspecified: Secondary | ICD-10-CM | POA: Diagnosis not present

## 2017-12-01 DIAGNOSIS — I480 Paroxysmal atrial fibrillation: Secondary | ICD-10-CM | POA: Diagnosis not present

## 2017-12-01 DIAGNOSIS — M25512 Pain in left shoulder: Secondary | ICD-10-CM | POA: Insufficient documentation

## 2017-12-01 DIAGNOSIS — M542 Cervicalgia: Secondary | ICD-10-CM | POA: Insufficient documentation

## 2017-12-01 DIAGNOSIS — M4802 Spinal stenosis, cervical region: Secondary | ICD-10-CM | POA: Diagnosis not present

## 2017-12-01 DIAGNOSIS — M5412 Radiculopathy, cervical region: Secondary | ICD-10-CM

## 2017-12-01 DIAGNOSIS — M4722 Other spondylosis with radiculopathy, cervical region: Secondary | ICD-10-CM | POA: Insufficient documentation

## 2017-12-01 DIAGNOSIS — G2581 Restless legs syndrome: Secondary | ICD-10-CM | POA: Insufficient documentation

## 2017-12-01 DIAGNOSIS — Z87891 Personal history of nicotine dependence: Secondary | ICD-10-CM | POA: Diagnosis not present

## 2017-12-01 DIAGNOSIS — Z87442 Personal history of urinary calculi: Secondary | ICD-10-CM | POA: Diagnosis not present

## 2017-12-01 MED ORDER — SODIUM CHLORIDE 0.9% FLUSH
1.0000 mL | Freq: Once | INTRAVENOUS | Status: AC
  Administered 2017-12-01: 10 mL

## 2017-12-01 MED ORDER — ONDANSETRON HCL 4 MG/2ML IJ SOLN
INTRAMUSCULAR | Status: AC
  Filled 2017-12-01: qty 2

## 2017-12-01 MED ORDER — FENTANYL CITRATE (PF) 100 MCG/2ML IJ SOLN
25.0000 ug | INTRAMUSCULAR | Status: AC | PRN
Start: 2017-12-01 — End: 2017-12-01
  Administered 2017-12-01: 50 ug via INTRAVENOUS
  Administered 2017-12-01: 25 ug via INTRAVENOUS
  Filled 2017-12-01: qty 2

## 2017-12-01 MED ORDER — LACTATED RINGERS IV SOLN
1000.0000 mL | Freq: Once | INTRAVENOUS | Status: AC
  Administered 2017-12-01: 1000 mL via INTRAVENOUS

## 2017-12-01 MED ORDER — LIDOCAINE HCL (PF) 1 % IJ SOLN
4.5000 mL | Freq: Once | INTRAMUSCULAR | Status: AC
  Administered 2017-12-01: 5 mL
  Filled 2017-12-01: qty 5

## 2017-12-01 MED ORDER — IOPAMIDOL (ISOVUE-M 200) INJECTION 41%
10.0000 mL | Freq: Once | INTRAMUSCULAR | Status: AC
  Administered 2017-12-01: 20 mL via EPIDURAL
  Filled 2017-12-01: qty 10

## 2017-12-01 MED ORDER — ROPIVACAINE HCL 2 MG/ML IJ SOLN
1.0000 mL | Freq: Once | INTRAMUSCULAR | Status: AC
  Administered 2017-12-01: 1 mL via EPIDURAL
  Filled 2017-12-01: qty 10

## 2017-12-01 MED ORDER — DEXAMETHASONE SODIUM PHOSPHATE 10 MG/ML IJ SOLN
10.0000 mg | Freq: Once | INTRAMUSCULAR | Status: AC
  Administered 2017-12-01: 10 mg
  Filled 2017-12-01: qty 1

## 2017-12-01 MED ORDER — ONDANSETRON HCL 4 MG/2ML IJ SOLN
4.0000 mg | Freq: Once | INTRAMUSCULAR | Status: AC
  Administered 2017-12-01: 4 mg via INTRAVENOUS
  Filled 2017-12-01: qty 2

## 2017-12-01 MED ORDER — PREGABALIN 50 MG PO CAPS: 50.0000 mg | ORAL_CAPSULE | Freq: Two times a day (BID) | ORAL | 1 refills | Status: DC

## 2017-12-01 NOTE — Progress Notes (Signed)
Safety precautions to be maintained throughout the outpatient stay will include: orient to surroundings, keep bed in low position, maintain call bell within reach at all times, provide assistance with transfer out of bed and ambulation.  

## 2017-12-01 NOTE — Progress Notes (Signed)
Patient's Name: Aaron Watson  MRN: [id number]  Referring Provider: Venetia NightYarbrough, Chester, MD  DOB: 07/06/1961  PCP: Steele Sizerrissman, Mark A, MD  DOS: 12/01/2017  Note by: Edward JollyBilal Suetta Hoffmeister, MD  Service setting: Ambulatory outpatient  Specialty: Interventional Pain Management  Patient type: Established  Location: [facility] (AMB) Pain Management Facility  Visit type: Interventional Procedure   Primary Reason for Visit: Interventional Pain Management Treatment. CC: Neck Pain (left ) and Shoulder Pain (left)  Procedure:  Anesthesia, Analgesia, Anxiolysis:  Type: Diagnostic, Inter-Laminar, Epidural Steroid Injection Region: Posterior Cervico-thoracic Region Level: T1-2 Laterality: Left-Sided Paramedial  Type: Local Anesthesia with Moderate (Conscious) Sedation Local Anesthetic: Lidocaine 1% Route: Intravenous (IV) IV Access: Secured Sedation: Meaningful verbal contact was maintained at all times during the procedure  Indication(s): Analgesia and Anxiety   Indications: 1. Cervical radiculopathy   2. Cervical radiculopathy due to degenerative joint disease of spine   3. Cervicalgia   4. Chronic pain syndrome    Pain Score: Pre-procedure: 7 /10 Post-procedure: 0-No pain/10  Pre-op Assessment:  [name] is a 56 y.o. (year old), male patient, seen today for interventional treatment. He  has a past surgical history that includes Shoulder surgery (Left); Cardiac catheterization (2008); transthoracic echocardiogram (July 20 13th; April 2016); NM MYOVIEW LTD (April 2008); and NM MYOVIEW LTD (2013). [name] has a current medication list which includes the following prescription(s): ascorbic acid, aspirin, b complex-c, flecainide, arginine, pramipexole dihydrochloride, probiotic product, sertraline, tadalafil, valacyclovir, meloxicam, methylprednisolone, misc natural products, multivitamin, and pregabalin. His primarily concern today is the Neck Pain (left ) and Shoulder Pain (left)  Initial Vital Signs: Blood  pressure (!) 131/97, pulse 65, temperature 98.2 F (36.8 C), resp. rate 18, height 5' 11.5" (1.816 m), weight 192 lb (87.1 kg), SpO2 99 %. BMI: Estimated body mass index is 26.41 kg/m as calculated from the following:   Height as of this encounter: 5' 11.5" (1.816 m).   Weight as of this encounter: 192 lb (87.1 kg).  Risk Assessment: Allergies: Reviewed. He has No Known Allergies.  Allergy Precautions: None required Coagulopathies: Reviewed. None identified.  Blood-thinner therapy: None at this time Active Infection(s): Reviewed. None identified. [name] is afebrile  Site Confirmation: [name] was asked to confirm the procedure and laterality before marking the site Procedure checklist: Completed Consent: Before the procedure and under the influence of no sedative(s), amnesic(s), or anxiolytics, the patient was informed of the treatment options, risks and possible complications. To fulfill our ethical and legal obligations, as recommended by the American Medical Association's Code of Ethics, I have informed the patient of my clinical impression; the nature and purpose of the treatment or procedure; the risks, benefits, and possible complications of the intervention; the alternatives, including doing nothing; the risk(s) and benefit(s) of the alternative treatment(s) or procedure(s); and the risk(s) and benefit(s) of doing nothing. The patient was provided information about the general risks and possible complications associated with the procedure. These may include, but are not limited to: failure to achieve desired goals, infection, bleeding, organ or nerve damage, allergic reactions, paralysis, and death. In addition, the patient was informed of those risks and complications associated to Spine-related procedures, such as failure to decrease pain; infection (i.e.: Meningitis, epidural or intraspinal abscess); bleeding (i.e.: epidural hematoma, subarachnoid hemorrhage, or any other type of  intraspinal or peri-dural bleeding); organ or nerve damage (i.e.: Any type of peripheral nerve, nerve root, or spinal cord injury) with subsequent damage to sensory, motor, and/or autonomic systems, resulting in permanent  pain, numbness, and/or weakness of one or several areas of the body; allergic reactions; (i.e.: anaphylactic reaction); and/or death. Furthermore, the patient was informed of those risks and complications associated with the medications. These include, but are not limited to: allergic reactions (i.e.: anaphylactic or anaphylactoid reaction(s)); adrenal axis suppression; blood sugar elevation that in diabetics may result in ketoacidosis or comma; water retention that in patients with history of congestive heart failure may result in shortness of breath, pulmonary edema, and decompensation with resultant heart failure; weight gain; swelling or edema; medication-induced neural toxicity; particulate matter embolism and blood vessel occlusion with resultant organ, and/or nervous system infarction; and/or aseptic necrosis of one or more joints. Finally, the patient was informed that Medicine is not an exact science; therefore, there is also the possibility of unforeseen or unpredictable risks and/or possible complications that may result in a catastrophic outcome. The patient indicated having understood very clearly. We have given the patient no guarantees and we have made no promises. Enough time was given to the patient to ask questions, all of which were answered to the patient's satisfaction. Mr. Wichert has indicated that he wanted to continue with the procedure. Attestation: I, the ordering provider, attest that I have discussed with the patient the benefits, risks, side-effects, alternatives, likelihood of achieving goals, and potential problems during recovery for the procedure that I have provided informed consent. Date: 12/01/2017; Time: 2:26 PM  Pre-Procedure Preparation:  Monitoring: As  per clinic protocol. Respiration, ETCO2, SpO2, BP, heart rate and rhythm monitor placed and checked for adequate function Safety Precautions: Patient was assessed for positional comfort and pressure points before starting the procedure. Time-out: I initiated and conducted the "Time-out" before starting the procedure, as per protocol. The patient was asked to participate by confirming the accuracy of the "Time Out" information. Verification of the correct person, site, and procedure were performed and confirmed by me, the nursing staff, and the patient. "Time-out" conducted as per Joint Commission's Universal Protocol (UP.01.01.01). "Time-out" Date & Time: 12/01/2017; 1049 hrs.  Description of Procedure Process:   Position: Prone with head of the table was raised to facilitate breathing. Target Area: For Epidural Steroid injections the target is the interlaminar space, initially targeting the lower border of the superior vertebral body lamina. Approach: Paramedial approach. Area Prepped: Entire PosteriorCervical Region Prepping solution: ChloraPrep (2% chlorhexidine gluconate and 70% isopropyl alcohol) Safety Precautions: Aspiration looking for blood return was conducted prior to all injections. At no point did we inject any substances, as a needle was being advanced. No attempts were made at seeking any paresthesias. Safe injection practices and needle disposal techniques used. Medications properly checked for expiration dates. SDV (single dose vial) medications used. Description of the Procedure: Protocol guidelines were followed. The procedure needle was introduced through the skin, ipsilateral to the reported pain, and advanced to the target area. Bone was contacted and the needle walked caudad, until the lamina was cleared. The epidural space was identified using "loss-of-resistance technique" with 2-3 ml of PF-NaCl (0.9% NSS), in a 5cc LOR glass syringe. Vitals:   12/01/17 1112 12/01/17 1115  12/01/17 1125 12/01/17 1135  BP: (!) 140/100 (!) 138/92 (!) 129/94 (!) 131/97  Pulse: 72 73 69 65  Resp: 17 16 16 18   Temp:   98.2 F (36.8 C)   TempSrc:      SpO2: 95% 96% 98% 99%  Weight:      Height:        Start Time: 1052 hrs. End Time: 1114  hrs. Materials:  Needle(s) Type: Epidural needle Gauge: 17G Length: 3.5-in Medication(s): We administered fentaNYL, lactated ringers, iopamidol, dexamethasone, ropivacaine (PF) 2 mg/mL (0.2%), sodium chloride flush, lidocaine (PF), and ondansetron. Please see chart orders for dosing details. 3 cc solution made of 1.5 cc of preservative-free saline, 0.5 cc of 0.2% ropivacaine, 1 cc of Decadron 10 mg/cc. Imaging Guidance (Spinal):  Type of Imaging Technique: Fluoroscopy Guidance (Spinal) Indication(s): Assistance in needle guidance and placement for procedures requiring needle placement in or near specific anatomical locations not easily accessible without such assistance. Exposure Time: Please see nurses notes. Contrast: Before injecting any contrast, we confirmed that the patient did not have an allergy to iodine, shellfish, or radiological contrast. Once satisfactory needle placement was completed at the desired level, radiological contrast was injected. Contrast injected under live fluoroscopy. No contrast complications. See chart for type and volume of contrast used. Fluoroscopic Guidance: I was personally present during the use of fluoroscopy. "Tunnel Vision Technique" used to obtain the best possible view of the target area. Parallax error corrected before commencing the procedure. "Direction-depth-direction" technique used to introduce the needle under continuous pulsed fluoroscopy. Once target was reached, antero-posterior, oblique, and lateral fluoroscopic projection used confirm needle placement in all planes. Images permanently stored in EMR. Interpretation: I personally interpreted the imaging intraoperatively. Adequate needle placement  confirmed in multiple planes. Appropriate spread of contrast into desired area was observed. No evidence of afferent or efferent intravascular uptake. No intrathecal or subarachnoid spread observed. Permanent images saved into the patient's record.  Antibiotic Prophylaxis:  Indication(s): None identified Antibiotic given: None  Post-operative Assessment:  EBL: None Complications: No immediate post-treatment complications observed by team, or reported by patient. Note: The patient tolerated the entire procedure well. A repeat set of vitals were taken after the procedure and the patient was kept under observation following institutional policy, for this type of procedure. Post-procedural neurological assessment was performed, showing return to baseline, prior to discharge. The patient was provided with post-procedure discharge instructions, including a section on how to identify potential problems. Should any problems arise concerning this procedure, the patient was given instructions to immediately contact us, at any time, without hesitation. In any case, we plan to contact the patient by telephone for a follow-up status report regarding this interventional procedure. Comments:  No additional relevant information. 5 out of 5 strength bilateral upper extremity: Shoulder abduction, elbow flexion, elbow extension, thumb extension.  Plan of Care   Imaging Orders     DG C-Arm 1-60 Min-No Report Procedure Orders    No procedure(s) ordered today    Medications ordered for procedure: Meds ordered this encounter  Medications  . fentaNYL (SUBLIMAZE) injection 25-100 mcg    Make sure Narcan is available in the pyxis when using this medication. In the event of respiratory depression (RR< 8/min): Titrate NARCAN (naloxone) in increments of 0.1 to 0.2 mg IV at 2-3 minute intervals, until desired degree of reversal.  . lactated ringers infusion 1,000 mL  . iopamidol (ISOVUE-M) 41 % intrathecal injection  10 mL  . dexamethasone (DECADRON) injection 10 mg  . ropivacaine (PF) 2 mg/mL (0.2%) (NAROPIN) injection 1 mL  . sodium chloride flush (NS) 0.9 % injection 1 mL  . lidocaine (PF) (XYLOCAINE) 1 % injection 4.5 mL  . ondansetron (ZOFRAN) injection 4 mg  . pregabalin (LYRICA) 50 MG capsule    Sig: Take 1 capsule (50 mg total) by mouth 2 (two) times daily.    Dispense:  60 capsule  Refill:  1    Do not place this medication, or any other prescription from our practice, on "Automatic Refill". Patient may have prescription filled one day early if pharmacy is closed on scheduled refill date.   Medications administered: We administered fentaNYL, lactated ringers, iopamidol, dexamethasone, ropivacaine (PF) 2 mg/mL (0.2%), sodium chloride flush, lidocaine (PF), and ondansetron.  See the medical record for exact dosing, route, and time of administration.  This SmartLink is deprecated. Use AVSMEDLIST instead to display the medication list for a patient. Disposition: Discharge home  Discharge Date & Time: 12/01/2017; 1139 hrs.   Physician-requested Follow-up: Return in about 3 weeks (around 12/22/2017) for Post Procedure Evaluation. Future Appointments  Date Time Provider Department Center  12/27/2017  3:30 PM Steele Sizer, MD [facility]  12/29/2017  2:00 PM Edward Jolly, MD [facility] None   Primary Care Physician: Steele Sizer, MD Location: [facility] Outpatient Pain Management Facility Note by: Edward Jolly, MD Date: 12/01/2017; Time: 2:29 PM  Disclaimer:  Medicine is not an exact science. The only guarantee in medicine is that nothing is guaranteed. It is important to note that the decision to proceed with this intervention was based on the information collected from the patient. The Data and conclusions were drawn from the patient's questionnaire, the interview, and the physical examination. Because the information was provided in large part by the patient, it cannot be guaranteed that it  has not been purposely or unconsciously manipulated. Every effort has been made to obtain as much relevant data as possible for this evaluation. It is important to note that the conclusions that lead to this procedure are derived in large part from the available data. Always take into account that the treatment will also be dependent on availability of resources and existing treatment guidelines, considered by other Pain Management Practitioners as being common knowledge and practice, at the time of the intervention. For Medico-Legal purposes, it is also important to point out that variation in procedural techniques and pharmacological choices are the acceptable norm. The indications, contraindications, technique, and results of the above procedure should only be interpreted and judged by a Board-Certified Interventional Pain Specialist with extensive familiarity and expertise in the same exact procedure and technique.

## 2017-12-01 NOTE — Telephone Encounter (Signed)
Patient will need refill on his sertraline before his appointment on 1/14. His appointment for 1/2 had to be rescheduled.  It appears that this has been taken care of but just making sure.  Thank you

## 2017-12-01 NOTE — Progress Notes (Signed)
Patient's Name: Aaron Watson  MRN: [id number]  Referring Provider: Meade Maw, MD  DOB: 1961/11/20  PCP: Guadalupe Maple, MD  DOS: 12/01/2017  Note by: Gillis Santa, MD  Service setting: Ambulatory outpatient  Specialty: Interventional Pain Management  Location: [facility] (AMB) Pain Management Facility  Visit type: Initial Patient Evaluation= FAST TRACK  Patient type: New Patient   Primary Reason(s) for Visit: Encounter for initial evaluation of one or more chronic problems (new to examiner) potentially causing chronic pain, and posing a threat to normal musculoskeletal function. (Level of risk: High) CC: Neck Pain (left ) and Shoulder Pain (left)  HPI  [name] is a 56 y.o. year old, male patient, who comes today to see [place] for the first time for an initial evaluation of his chronic pain. He has Hyperlipidemia; Paroxysmal atrial fibrillation ([place]); Restless leg syndrome; Depression; and OSA on CPAP on their problem list. Today he comes in for evaluation of his Neck Pain (left ) and Shoulder Pain (left)  Pain Assessment: Location: Left Neck Radiating: into left shoulder and down the arm.  hand remains in a state of numbness and tingling and is now traveling up towards the elbow  Onset: More than a month ago Duration: Chronic pain Quality: Tingling, Numbness, Discomfort, Constant Severity: 7 /10 (self-reported pain score)  Note: Reported level is inconsistent with clinical observations. Clinically the patient looks like a 2/10 A 2/10 is viewed as "Mild to Moderate" and described as noticeable and distracting. Impossible to hide from other people. More frequent flare-ups. Still possible to adapt and function close to normal. It can be very annoying and may have occasional stronger flare-ups. With discipline, patients may get used to it and adapt.       When using our objective Pain Scale, levels between 6 and 10/10 are said to belong in an emergency room, as it progressively worsens from a  6/10, described as severely limiting, requiring emergency care not usually available at an outpatient pain management facility. At a 6/10 level, communication becomes difficult and requires great effort. Assistance to reach the emergency department may be required. Facial flushing and profuse sweating along with potentially dangerous increases in heart rate and blood pressure will be evident. Effect on ADL: affecting sleep at this point d/t the pain.  feels as though he has exhausted all of his options for pain relief.  Timing: Constant Modifying factors: nothing  Onset and Duration: Gradual and Date of onset: 05/2017 Cause of pain: Unknown Severity: Getting worse, NAS-11 at its worse: 8/10, NAS-11 at its best: 5/10, NAS-11 now: 7/10 and NAS-11 on the average: 8/10 Timing: Not influenced by the time of the day Aggravating Factors: Lifiting, Motion and Twisting Alleviating Factors: none Associated Problems: Inability to concentrate, Numbness, Personality changes, Tingling, Pain that wakes patient up and Pain that does not allow patient to sleep Quality of Pain: Aching, Agonizing, Annoying, Exhausting, Getting longer, Nagging, Sharp, Shooting, Tingling, Tiring and Uncomfortable Previous Examinations or Tests: MRI scan, X-rays, Neurological evaluation, Neurosurgical evaluation and Chiropractic evaluation Previous Treatments: The patient denies denies  The patient comes into the clinics today for the first time for a chronic pain management evaluation.   Fast track patient being referred for a cervical epidural steroid injection.  In brief patient endorses onset of neck and left arm pain starting in July.  Patient states that he was working outside with his lawnmower which got stuck and we attempted to pull it he felt a strain in his neck.  Pain is gotten worse over time.  It is resulting in pain and tingling of his entire left hand most pronounced in his thumb and index finger.  This is consistent  with his cervical MRI which shows disc herniation at C6-C7.  Patient has not endorsed dropping items he does feel occasional burning and tingling sensations in his fingertips which impairs a sensation when holding things.  Patient is only on aspirin 81 mg.   Meds   Current Outpatient Medications:  .  Ascorbic Acid (VITAMIN C PO), Take by mouth daily., Disp: , Rfl:  .  aspirin 81 MG tablet, Take 81 mg by mouth daily., Disp: , Rfl:  .  B Complex-C (SUPER B COMPLEX PO), Take by mouth daily., Disp: , Rfl:  .  flecainide (TAMBOCOR) 150 MG tablet, Take 1 tablet (150 mg total) by mouth 2 (two) times daily., Disp: 60 tablet, Rfl: 0 .  L-ARGININE PO, Take 5,100 mg by mouth daily., Disp: , Rfl:  .  Pramipexole Dihydrochloride (MIRAPEX PO), Take by mouth at bedtime. Takes 3 tabs at bedtime, Disp: , Rfl:  .  Probiotic Product (PROBIOTIC PO), Take by mouth daily., Disp: , Rfl:  .  sertraline (ZOLOFT) 100 MG tablet, TAKE 1 TABLET(100 MG) BY MOUTH DAILY, Disp: 90 tablet, Rfl: 0 .  Tadalafil 2.5 MG TABS, Take 1 tablet (2.5 mg total) by mouth daily., Disp: 90 tablet, Rfl: 1 .  valACYclovir (VALTREX) 500 MG tablet, Take 1 tablet (500 mg total) by mouth daily., Disp: 90 tablet, Rfl: 4 .  meloxicam (MOBIC) 15 MG tablet, Take 1 tablet (15 mg total) by mouth daily. (Patient not taking: Reported on 12/01/2017), Disp: 30 tablet, Rfl: 3 .  methylPREDNISolone (MEDROL DOSEPAK) 4 MG TBPK tablet, 6 day dose pack - take as directed (Patient not taking: Reported on 12/01/2017), Disp: 21 tablet, Rfl: 0 .  Misc Natural Products (PROSTATE SUPPORT PO), Take by mouth daily., Disp: , Rfl:  .  Multiple Vitamin (MULTIVITAMIN) tablet, Take 1 tablet by mouth daily., Disp: , Rfl:  .  pregabalin (LYRICA) 50 MG capsule, Take 1 capsule (50 mg total) by mouth 2 (two) times daily., Disp: 60 capsule, Rfl: 1  Imaging Review  Cervical Imaging: Cervical MR wo contrast:  Results for orders placed during the hospital encounter of 11/19/17  MR  CERVICAL SPINE WO CONTRAST   Narrative CLINICAL DATA:  Neck pain. Bilateral are pain, worse on the left. Numbness and tingling in the left arm and hand. Symptoms for 2-3 months. Lifting injury.  EXAM: MRI CERVICAL SPINE WITHOUT CONTRAST  TECHNIQUE: Multiplanar, multisequence MR imaging of the cervical spine was performed. No intravenous contrast was administered.  COMPARISON:  None.  FINDINGS: Alignment: Trace retrolisthesis of C3 on C4.  Vertebrae: No fracture or osseous lesion. Mild degenerative endplate edema at K0-2.  Cord: Normal signal.  Posterior Fossa, vertebral arteries, paraspinal tissues: Unremarkable.  Disc levels:  C2-3: Minimal facet arthrosis and minimal uncovertebral spurring with minimal right neural foraminal narrowing. No spinal stenosis.  C3-4: Broad-based posterior disc osteophyte complex and mild-to-moderate right facet arthrosis result in moderate right and mild left neural foraminal stenosis without significant spinal stenosis.  C4-5: Broad central disc protrusion without significant spinal stenosis. Mild uncovertebral spurring and mild facet arthrosis result in mild right neural foraminal stenosis.  C5-6: Moderate disc space narrowing. Broad-based posterior disc osteophyte complex results in mild-to-moderate spinal stenosis and severe right greater than left neural foraminal stenosis.  C6-7: Mild disc space narrowing. Moderate-sized left paracentral disc  protrusion results in mild-to-moderate spinal stenosis with slight left ventral cord flattening and moderate left neural foraminal stenosis. Potential left C7 nerve root impingement.  C7-T1:  Negative.  IMPRESSION: 1. Left-sided disc protrusion at C6-7 resulting in mild-to-moderate spinal stenosis, moderate left foraminal stenosis, and potential left C7 nerve root impingement. 2. Moderately advanced disc degeneration at C5-6 with mild-to-moderate spinal and severe neural foraminal  stenosis.   Electronically Signed   By: Logan Bores M.D.   On: 11/19/2017 11:16     Foot Imaging: Foot-R DG Complete:  Results for orders placed in visit on 09/27/17  DG Foot Complete Right   Narrative Please see detailed radiograph report in office note.   Foot-L DG Complete: No results found for this or any previous visit.  Complexity Note: Imaging results reviewed. Results shared with Mr. Throckmorton, using Layman's terms.                         ROS  Cardiovascular History: Abnormal heart rhythm, Daily Aspirin intake, Heart catheterization and Blood thinners:  Antiplatelet Pulmonary or Respiratory History: Snoring  and Temporary stoppage of breathing during sleep Neurological History: No reported neurological signs or symptoms such as seizures, abnormal skin sensations, urinary and/or fecal incontinence, being born with an abnormal open spine and/or a tethered spinal cord Review of Past Neurological Studies: No results found for this or any previous visit. Psychological-Psychiatric History: Depressed Gastrointestinal History: Reflux or heatburn Genitourinary History: No reported renal or genitourinary signs or symptoms such as difficulty voiding or producing urine, peeing blood, non-functioning kidney, kidney stones, difficulty emptying the bladder, difficulty controlling the flow of urine, or chronic kidney disease Hematological History: No reported hematological signs or symptoms such as prolonged bleeding, low or poor functioning platelets, bruising or bleeding easily, hereditary bleeding problems, low energy levels due to low hemoglobin or being anemic Endocrine History: No reported endocrine signs or symptoms such as high or low blood sugar, rapid heart rate due to high thyroid levels, obesity or weight gain due to slow thyroid or thyroid disease Rheumatologic History: No reported rheumatological signs and symptoms such as fatigue, joint pain, tenderness, swelling, redness, heat,  stiffness, decreased range of motion, with or without associated rash Musculoskeletal History: Negative for myasthenia gravis, muscular dystrophy, multiple sclerosis or malignant hyperthermia Work History: Working full time  Allergies  Mr. Leitzel has No Known Allergies.  Laboratory Chemistry  Inflammation Markers (CRP: Acute Phase) (ESR: Chronic Phase) Lab Results  Component Value Date   CRP <0.3 02/18/2016   ESRSEDRATE 2 02/18/2016                 Rheumatology Markers Lab Results  Component Value Date   ANA Negative 02/18/2016   LYMEIGGIGMAB <0.91 02/18/2016                Renal Function Markers Lab Results  Component Value Date   BUN 14 04/24/2016   CREATININE 1.15 04/24/2016   GFRAA 84 04/24/2016   GFRNONAA 72 04/24/2016                 Hepatic Function Markers Lab Results  Component Value Date   AST 23 11/02/2016   ALT 22 11/02/2016   ALBUMIN 4.6 04/24/2016   ALKPHOS 59 04/24/2016   HCVAB <0.1 01/20/2016                 Electrolytes Lab Results  Component Value Date   NA 138 04/24/2016   K 4.7  04/24/2016   CL 98 04/24/2016   CALCIUM 9.7 04/24/2016                 Neuropathy Markers Lab Results  Component Value Date   EVQWQVLD44 461 02/18/2016   HIV NON REACTIVE 01/20/2016                 Bone Pathology Markers Lab Results  Component Value Date   VD25OH 16.4 (L) 01/13/2016   TESTOFREE 19.1 01/14/2016   TESTOSTERONE 379 01/14/2016                 Coagulation Parameters Lab Results  Component Value Date   PLT 266 05/12/2017                 Cardiovascular Markers Lab Results  Component Value Date   HGB 14.3 05/12/2017   HCT 42.3 05/12/2017                 CA Markers No results found for: CEA, CA125, LABCA2               Note: Lab results reviewed.  [place]  Drug: Mr. Fayson  reports that he does not use drugs. Alcohol:  reports that he drinks alcohol. Tobacco:  reports that he has quit smoking. His smoking use included cigarettes. He  has a 5.00 pack-year smoking history. he has never used smokeless tobacco. Medical:  has a past medical history of Anxiety, Arthritis, Depression, GERD (gastroesophageal reflux disease), Itching, OSA on CPAP, Restless leg syndrome, and Sleep apnea. Family: family history includes Heart attack in his maternal grandfather; Hypertension in his father and mother; Neuropathy in his father.  Past Surgical History:  Procedure Laterality Date  . CARDIAC CATHETERIZATION  2008   [facility]: 40% mid LAD lesion. Otherwise insignificant/minimal CAD.  Marland Kitchen NM MYOVIEW LTD  April 2008   Dr. Humphrey Rolls ([place]) : Inferior wall defect, EF 55%. -- Suggested to the due to ischemia in RCA territory versus diaphragmatic attenuation normal EF.  Marland Kitchen NM MYOVIEW LTD  2013   Treadmill Myoview [facility]) no evidence of ischemia or infarction. Normal EF.  Marland Kitchen SHOULDER SURGERY Left   . TRANSTHORACIC ECHOCARDIOGRAM  July 20 13th; April 2016   At [facility]: a) normal LV function. Mild LA dilation, mild LVH and mild TR/MR. b) Normal LV size and function. EF 55-60%. No RW MA. Trivial MR and TR. Otherwise normal   Active Ambulatory Problems    Diagnosis Date Noted  . Hyperlipidemia   . Paroxysmal atrial fibrillation (H[facility])   . Restless leg syndrome 04/27/2016  . Depression 04/27/2016  . OSA on CPAP 04/27/2016   Resolved Ambulatory Problems    Diagnosis Date Noted  . Other fatigue 08/30/2015  . Paresthesia 02/18/2016   Past Medical History:  Diagnosis Date  . Anxiety   . Arthritis   . Depression   . GERD (gastroesophageal reflux disease)   . Itching   . OSA on CPAP   . Restless leg syndrome   . Sleep apnea    Constitutional Exam  General appearance: Well nourished, well developed, and well hydrated. In no apparent acute distress Vitals:   12/01/17 1112 12/01/17 1115 12/01/17 1125 12/01/17 1135  BP: (!) 140/100 (!) 138/92 (!) 129/94 (!) 131/97  Pulse: 72 73 69 65  Resp: _0 Temp:   98.2 F (36.8 C)    TempSrc:      SpO2: 95% 96% 98% 99%  Weight:  Height:       BMI Assessment: Estimated body mass index is 26.41 kg/m as calculated from the following:   Height as of this encounter: 5' 11.5" (1.816 m).   Weight as of this encounter: 192 lb (87.1 kg).  BMI interpretation table: BMI level Category Range association with higher incidence of chronic pain  <18 kg/m2 Underweight   18.5-24.9 kg/m2 Ideal body weight   25-29.9 kg/m2 Overweight Increased incidence by 20%  30-34.9 kg/m2 Obese (Class I) Increased incidence by 68%  35-39.9 kg/m2 Severe obesity (Class II) Increased incidence by 136%  >40 kg/m2 Extreme obesity (Class III) Increased incidence by 254%   BMI Readings from Last 4 Encounters:  12/01/17 26.41 kg/m  05/12/17 25.48 kg/m  04/12/17 24.72 kg/m  11/02/16 28.41 kg/m   Wt Readings from Last 4 Encounters:  12/01/17 192 lb (87.1 kg)  05/12/17 178 lb (80.7 kg)  04/12/17 179 lb 12 oz (81.5 kg)  11/02/16 198 lb (89.8 kg)  Psych/Mental status: Alert, oriented x 3 (person, place, & time)       Eyes: PERLA Respiratory: No evidence of acute respiratory distress  Cervical Spine Area Exam  Skin & Axial Inspection: No masses, redness, edema, swelling, or associated skin lesions Alignment: Symmetrical Functional ROM: Decreased ROM, to the left Stability: No instability detected Muscle Tone/Strength: Functionally intact. No obvious neuro-muscular anomalies detected. Sensory (Neurological): Dermatomal pain pattern Palpation: Complains of area being tender to palpation             Positive Spurling's on the left. Upper Extremity (UE) Exam    Side: Right upper extremity  Side: Left upper extremity  Skin & Extremity Inspection: Skin color, temperature, and hair growth are WNL. No peripheral edema or cyanosis. No masses, redness, swelling, asymmetry, or associated skin lesions. No contractures.  Skin & Extremity Inspection: Skin color, temperature, and hair growth are WNL. No  peripheral edema or cyanosis. No masses, redness, swelling, asymmetry, or associated skin lesions. No contractures.  Functional ROM: Unrestricted ROM          Functional ROM: Unrestricted ROM          Muscle Tone/Strength: Functionally intact. No obvious neuro-muscular anomalies detected.  Muscle Tone/Strength: Functionally intact. No obvious neuro-muscular anomalies detected.  Sensory (Neurological): Unimpaired          Sensory (Neurological): Unimpaired          Palpation: No palpable anomalies              Palpation: No palpable anomalies              Specialized Test(s): Deferred         Specialized Test(s): Deferred         5 out of 5 strength bilateral upper extremity: Shoulder abduction, elbow flexion, elbow extension, thumb extension.  Thoracic Spine Area Exam  Skin & Axial Inspection: No masses, redness, or swelling Alignment: Symmetrical Functional ROM: Unrestricted ROM Stability: No instability detected Muscle Tone/Strength: Functionally intact. No obvious neuro-muscular anomalies detected. Sensory (Neurological): Unimpaired Muscle strength & Tone: No palpable anomalies  Lumbar Spine Area Exam  Skin & Axial Inspection: No masses, redness, or swelling Alignment: Symmetrical Functional ROM: Unrestricted ROM      Stability: No instability detected Muscle Tone/Strength: Functionally intact. No obvious neuro-muscular anomalies detected. Sensory (Neurological): Unimpaired Palpation: No palpable anomalies       Provocative Tests: Lumbar Hyperextension and rotation test: evaluation deferred today       Lumbar Lateral bending test: evaluation  deferred today       Patrick's Maneuver: evaluation deferred today                    Gait & Posture Assessment  Ambulation: Unassisted Gait: Relatively normal for age and body habitus Posture: WNL   Lower Extremity Exam    Side: Right lower extremity  Side: Left lower extremity  Skin & Extremity Inspection: Skin color, temperature, and  hair growth are WNL. No peripheral edema or cyanosis. No masses, redness, swelling, asymmetry, or associated skin lesions. No contractures.  Skin & Extremity Inspection: Skin color, temperature, and hair growth are WNL. No peripheral edema or cyanosis. No masses, redness, swelling, asymmetry, or associated skin lesions. No contractures.  Functional ROM: Unrestricted ROM          Functional ROM: Unrestricted ROM          Muscle Tone/Strength: Functionally intact. No obvious neuro-muscular anomalies detected.  Muscle Tone/Strength: Functionally intact. No obvious neuro-muscular anomalies detected.  Sensory (Neurological): Unimpaired  Sensory (Neurological): Unimpaired  Palpation: No palpable anomalies  Palpation: No palpable anomalies   Assessment  Primary Diagnosis & Pertinent Problem List: The primary encounter diagnosis was Cervical radiculopathy. Diagnoses of Cervical radiculopathy due to degenerative joint disease of spine, Cervicalgia, and Chronic pain syndrome were also pertinent to this visit.  Visit Diagnosis (New problems to examiner): 1. Cervical radiculopathy   2. Cervical radiculopathy due to degenerative joint disease of spine   3. Cervicalgia   4. Chronic pain syndrome    Plan of Care (Initial workup plan)   Fast track patient being referred for a cervical epidural steroid injection.  In brief patient endorses onset of neck and left arm pain starting in July.  Patient states that he was working outside with his lawnmower which got stuck and we attempted to pull it he felt a strain in his neck.  Pain is gotten worse over time.  It is resulting in pain and tingling of his entire left hand most pronounced in his thumb and index finger.  This is consistent with his cervical MRI which shows disc herniation at C6-C7.  Patient has not endorsed dropping items he does feel occasional burning and tingling sensations in his fingertips which impairs a sensation when holding things.  Patient is  only on aspirin 81 mg. Risk and benefits of C-ESI discussed with patient and patient would like to proceed.  Plan: -Cervical epidural steroid injection, see procedure note -Lyrica 50 mg BID, Rx provided   Ordered Lab-work, Procedure(s), Referral(s), & Consult(s): Orders Placed This Encounter  Procedures  . DG C-Arm 1-60 Min-No Report   Pharmacotherapy (current): Medications ordered:  Meds ordered this encounter  Medications  . fentaNYL (SUBLIMAZE) injection 25-100 mcg    Make sure Narcan is available in the pyxis when using this medication. In the event of respiratory depression (RR< 8/min): Titrate NARCAN (naloxone) in increments of 0.1 to 0.2 mg IV at 2-3 minute intervals, until desired degree of reversal.  . lactated ringers infusion 1,000 mL  . iopamidol (ISOVUE-M) 41 % intrathecal injection 10 mL  . dexamethasone (DECADRON) injection 10 mg  . ropivacaine (PF) 2 mg/mL (0.2%) (NAROPIN) injection 1 mL  . sodium chloride flush (NS) 0.9 % injection 1 mL  . lidocaine (PF) (XYLOCAINE) 1 % injection 4.5 mL  . ondansetron (ZOFRAN) injection 4 mg  . pregabalin (LYRICA) 50 MG capsule    Sig: Take 1 capsule (50 mg total) by mouth 2 (two) times daily.  Dispense:  60 capsule    Refill:  1    Do not place this medication, or any other prescription from our practice, on "Automatic Refill". Patient may have prescription filled one day early if pharmacy is closed on scheduled refill date.   Medications administered during this visit: We administered fentaNYL, lactated ringers, iopamidol, dexamethasone, ropivacaine (PF) 2 mg/mL (0.2%), sodium chloride flush, lidocaine (PF), and ondansetron.     Provider-requested follow-up: Return in about 3 weeks (around 12/22/2017) for Post Procedure Evaluation.  Future Appointments  Date Time Provider [place]  12/27/2017  3:30 PM Guadalupe Maple, MD [facility]  12/29/2017  2:00 PM Gillis Santa, MD [facility] None    Primary Care Physician:  Guadalupe Maple, MD Location: [facility] Note by: Gillis Santa, M.D, Date: 12/01/2017; Time: 2:23 PM  There are no Patient Instructions on file for this visit.

## 2017-12-15 ENCOUNTER — Ambulatory Visit: Payer: BLUE CROSS/BLUE SHIELD | Admitting: Family Medicine

## 2017-12-17 ENCOUNTER — Other Ambulatory Visit: Payer: Self-pay

## 2017-12-17 ENCOUNTER — Encounter
Admission: RE | Admit: 2017-12-17 | Discharge: 2017-12-17 | Disposition: A | Discharge status: Home / Self Care | Payer: BLUE CROSS/BLUE SHIELD | Source: Ambulatory Visit | Attending: Neurosurgery | Admitting: Neurosurgery

## 2017-12-17 ENCOUNTER — Other Ambulatory Visit: Payer: BLUE CROSS/BLUE SHIELD

## 2017-12-17 DIAGNOSIS — G4733 Obstructive sleep apnea (adult) (pediatric): Secondary | ICD-10-CM | POA: Diagnosis not present

## 2017-12-17 DIAGNOSIS — F419 Anxiety disorder, unspecified: Secondary | ICD-10-CM | POA: Insufficient documentation

## 2017-12-17 DIAGNOSIS — M47892 Other spondylosis, cervical region: Secondary | ICD-10-CM | POA: Insufficient documentation

## 2017-12-17 DIAGNOSIS — Z9989 Dependence on other enabling machines and devices: Secondary | ICD-10-CM | POA: Insufficient documentation

## 2017-12-17 DIAGNOSIS — M5412 Radiculopathy, cervical region: Secondary | ICD-10-CM | POA: Insufficient documentation

## 2017-12-17 DIAGNOSIS — Z01810 Encounter for preprocedural cardiovascular examination: Secondary | ICD-10-CM | POA: Diagnosis present

## 2017-12-17 DIAGNOSIS — Z7952 Long term (current) use of systemic steroids: Secondary | ICD-10-CM | POA: Insufficient documentation

## 2017-12-17 DIAGNOSIS — Z79899 Other long term (current) drug therapy: Secondary | ICD-10-CM | POA: Insufficient documentation

## 2017-12-17 DIAGNOSIS — I444 Left anterior fascicular block: Secondary | ICD-10-CM | POA: Diagnosis not present

## 2017-12-17 DIAGNOSIS — G2581 Restless legs syndrome: Secondary | ICD-10-CM | POA: Diagnosis not present

## 2017-12-17 DIAGNOSIS — K219 Gastro-esophageal reflux disease without esophagitis: Secondary | ICD-10-CM | POA: Insufficient documentation

## 2017-12-17 DIAGNOSIS — Z9889 Other specified postprocedural states: Secondary | ICD-10-CM | POA: Insufficient documentation

## 2017-12-17 DIAGNOSIS — M1990 Unspecified osteoarthritis, unspecified site: Secondary | ICD-10-CM | POA: Diagnosis not present

## 2017-12-17 DIAGNOSIS — G894 Chronic pain syndrome: Secondary | ICD-10-CM | POA: Insufficient documentation

## 2017-12-17 DIAGNOSIS — F329 Major depressive disorder, single episode, unspecified: Secondary | ICD-10-CM | POA: Diagnosis not present

## 2017-12-17 DIAGNOSIS — I480 Paroxysmal atrial fibrillation: Secondary | ICD-10-CM | POA: Insufficient documentation

## 2017-12-17 DIAGNOSIS — Z87891 Personal history of nicotine dependence: Secondary | ICD-10-CM | POA: Diagnosis not present

## 2017-12-17 DIAGNOSIS — Z01812 Encounter for preprocedural laboratory examination: Secondary | ICD-10-CM | POA: Diagnosis not present

## 2017-12-17 HISTORY — DX: Nausea with vomiting, unspecified: R11.2

## 2017-12-17 HISTORY — DX: Other specified postprocedural states: Z98.890

## 2017-12-17 LAB — URINALYSIS, ROUTINE W REFLEX MICROSCOPIC
BILIRUBIN URINE: NEGATIVE
Bacteria, UA: NONE SEEN
Glucose, UA: NEGATIVE mg/dL
Hgb urine dipstick: NEGATIVE
KETONES UR: NEGATIVE mg/dL
NITRITE: NEGATIVE
PROTEIN: NEGATIVE mg/dL
SQUAMOUS EPITHELIAL / LPF: NONE SEEN
Specific Gravity, Urine: 1.015 (ref 1.005–1.030)
pH: 6 (ref 5.0–8.0)

## 2017-12-17 LAB — BASIC METABOLIC PANEL
ANION GAP: 10 (ref 5–15)
BUN: 23 mg/dL — AB (ref 6–20)
CHLORIDE: 101 mmol/L (ref 101–111)
CO2: 23 mmol/L (ref 22–32)
Calcium: 9.4 mg/dL (ref 8.9–10.3)
Creatinine, Ser: 0.98 mg/dL (ref 0.61–1.24)
GFR calc Af Amer: >60 mL/min (ref >60)
Glucose, Bld: 89 mg/dL (ref 65–99)
Potassium: 3.6 mmol/L (ref 3.5–5.1)
SODIUM: 134 mmol/L — AB (ref 135–145)

## 2017-12-17 LAB — DIFFERENTIAL
BASOS ABS: 0 10*3/uL (ref 0–0.1)
Basophils Relative: 0 %
Eosinophils Absolute: 0.1 10*3/uL (ref 0–0.7)
Eosinophils Relative: 1 %
LYMPHS PCT: 16 %
Lymphs Abs: 1.2 10*3/uL (ref 1.0–3.6)
MONO ABS: 0.4 10*3/uL (ref 0.2–1.0)
Monocytes Relative: 6 %
Neutro Abs: 5.6 10*3/uL (ref 1.4–6.5)
Neutrophils Relative %: 77 %

## 2017-12-17 LAB — APTT: APTT: 32 s (ref 24–36)

## 2017-12-17 LAB — CBC
HEMATOCRIT: 44.1 % (ref 40.0–52.0)
Hemoglobin: 15.2 g/dL (ref 13.0–18.0)
MCH: 31.3 pg (ref 26.0–34.0)
MCHC: 34.4 g/dL (ref 32.0–36.0)
MCV: 90.9 fL (ref 80.0–100.0)
PLATELETS: 246 10*3/uL (ref 150–440)
RBC: 4.85 MIL/uL (ref 4.40–5.90)
RDW: 12.9 % (ref 11.5–14.5)
WBC: 7.2 10*3/uL (ref 3.8–10.6)

## 2017-12-17 LAB — PROTIME-INR
INR: 1.05
PROTHROMBIN TIME: 13.6 s (ref 11.4–15.2)

## 2017-12-17 LAB — TYPE AND SCREEN
ABO/RH(D): A POS
Antibody Screen: NEGATIVE

## 2017-12-17 LAB — SURGICAL PCR SCREEN
MRSA, PCR: NEGATIVE
Staphylococcus aureus: NEGATIVE

## 2017-12-17 NOTE — Pre-Procedure Instructions (Signed)
Left anterior fascicular block noted on EKG from April 2017.

## 2017-12-17 NOTE — Patient Instructions (Signed)
Your procedure is scheduled on: Wednesday 12/29/17 Report to DAY SURGERY. 2ND FLOOR MEDICAL MALL ENTRANCE. To find out your arrival time please call [phone] between 1PM - 3PM on Tuesday 12/28/17.  Remember: Instructions that are not followed completely may result in serious medical risk, up to and including death, or upon the discretion of your surgeon and anesthesiologist your surgery may need to be rescheduled.    __X__ 1. Do not eat anything after midnight the night before your    procedure.  No gum chewing or hard candies.  You may drink clear   liquids up to 2 hours before you are scheduled to arrive at the   hospital for your procedure. Do not drink clear liquids within 2   hours of scheduled arrival to the hospital as this may lead to your   procedure being delayed or rescheduled.       Clear liquids include:   Water or Apple juice without pulp   Clear carbohydrate beverage such as Clearfast or Gatorade   Black coffee or Clear Tea (no milk, no creamer, do not add anything   to the coffee or tea)    Diabetics should only drink water   __X__ 2. No Alcohol for 24 hours before or after surgery.   ____ 3. Bring all medications with you on the day of surgery if instructed.    __X__ 4. Notify your doctor if there is any change in your medical condition     (cold, fever, infections).             __X___5. No smoking within 24 hours of your surgery.     Do not wear jewelry, make-up, hairpins, clips or nail polish.  Do not wear lotions, powders, or perfumes.   Do not shave 48 hours prior to surgery. Men may shave face and neck.  Do not bring valuables to the hospital.    [facility] is not responsible for any belongings or valuables.               Contacts, dentures or bridgework may not be worn into surgery.  Leave your suitcase in the car. After surgery it may be brought to your room.  For patients admitted to the hospital, discharge time is determined by your                 treatment team.   Patients discharged the day of surgery will not be allowed to drive home.   Please read over the following fact sheets that you were given:   MRSA Information   __X__ Take these medicines the morning of surgery with A SIP OF WATER:    1. FLECAINIDE  2. SERTRALINE  3. VALTREX  4.  5.  6.  ____ Fleet Enema (as directed)   __X__ Use CHG Soap/SAGE wipes as directed  ____ Use inhalers on the day of surgery  ____ Stop metformin 2 days prior to surgery    ____ Take 1/2 of usual insulin dose the night before surgery and none on the morning of surgery.   __X__ Stop Coumadin/Plavix/aspirin on 12/22/17  __X__ Stop Anti-inflammatories such as Advil, Aleve, Ibuprofen, Motrin, Naproxen, Naprosyn, Goodies,powder, or aspirin products.  OK to take Tylenol.   __X__ Stop supplements, Vitamin E, Fish Oil until after surgery.    __X__ Bring C-Pap to the hospital.

## 2017-12-20 NOTE — Pre-Procedure Instructions (Signed)
ua faxed to dr Myer Haffyarbrough office.

## 2017-12-21 NOTE — Pre-Procedure Instructions (Signed)
Faxed request for updated H&P.  Will be out of date by surgery date and needs heart and lung assessment

## 2017-12-27 ENCOUNTER — Encounter: Payer: Self-pay | Admitting: Family Medicine

## 2017-12-27 ENCOUNTER — Ambulatory Visit (INDEPENDENT_AMBULATORY_CARE_PROVIDER_SITE_OTHER): Payer: BLUE CROSS/BLUE SHIELD | Admitting: Family Medicine

## 2017-12-27 DIAGNOSIS — F3342 Major depressive disorder, recurrent, in full remission: Secondary | ICD-10-CM | POA: Diagnosis not present

## 2017-12-27 DIAGNOSIS — G2581 Restless legs syndrome: Secondary | ICD-10-CM | POA: Diagnosis not present

## 2017-12-27 MED ORDER — DULOXETINE HCL 30 MG PO CPEP: 30.0000 mg | ORAL_CAPSULE | Freq: Every day | ORAL | 0 refills | Status: DC

## 2017-12-27 MED ORDER — DULOXETINE HCL 60 MG PO CPEP: 60.0000 mg | ORAL_CAPSULE | Freq: Every day | ORAL | 3 refills | Status: DC

## 2017-12-27 NOTE — Progress Notes (Signed)
BP 134/86   Pulse 77   Wt 190 lb (86.2 kg)   SpO2 98%   BMI 26.13 kg/m    Subjective:    Patient ID: Aaron Watson, male    DOB: 01/09/1961, 57 y.o.   MRN: [id number]  HPI: Aaron Watson is a 57 y.o. male  Chief Complaint  Patient presents with  . Medication Problem    Medication Check  . Neck surgery    [facility]   discuss neck surgery patient having that done in 2 days. Has tried multiple othertreatmentsand therapies with no success.Discussed nerves depression symptoms of problems of just not doing as well with the Zoloft has been going on predating patient's neck problems. Is interested in possibly switch Relevant past medical, surgical, family and social history reviewed and updated as indicated. Interim medical history since our last visit reviewed. Allergies and medications reviewed and updated.  Review of Systems  Constitutional: Negative.   Respiratory: Negative.   Cardiovascular: Negative.     Per HPI unless specifically indicated above     Objective:    BP 134/86   Pulse 77   Wt 190 lb (86.2 kg)   SpO2 98%   BMI 26.13 kg/m   Wt Readings from Last 3 Encounters:  12/27/17 190 lb (86.2 kg)  12/17/17 192 lb (87.1 kg)  12/01/17 192 lb (87.1 kg)    Physical Exam  Constitutional: He is oriented to person, place, and time. He appears well-developed and well-nourished.  HENT:  Head: Normocephalic and atraumatic.  Eyes: Conjunctivae and EOM are normal.  Neck: Normal range of motion.  Cardiovascular: Normal rate, regular rhythm and normal heart sounds.  Pulmonary/Chest: Effort normal and breath sounds normal.  Musculoskeletal: Normal range of motion.  Neurological: He is alert and oriented to person, place, and time.  Skin: No erythema.  Psychiatric: He has a normal mood and affect. His behavior is normal. Judgment and thought content normal.    Results for orders placed or performed during the hospital encounter of 12/17/17    Surgical pcr screen  Result Value Ref Range   MRSA, PCR NEGATIVE NEGATIVE   Staphylococcus aureus NEGATIVE NEGATIVE  CBC  Result Value Ref Range   WBC 7.2 3.8 - 10.6 K/uL   RBC 4.85 4.40 - 5.90 MIL/uL   Hemoglobin 15.2 13.0 - 18.0 g/dL   HCT 82.944.1 56.240.0 - 13.052.0 %   MCV 90.9 80.0 - 100.0 fL   MCH 31.3 26.0 - 34.0 pg   MCHC 34.4 32.0 - 36.0 g/dL   RDW 86.512.9 78.411.5 - 69.614.5 %   Platelets 246 150 - 440 K/uL  Differential  Result Value Ref Range   Neutrophils Relative % 77 %   Neutro Abs 5.6 1.4 - 6.5 K/uL   Lymphocytes Relative 16 %   Lymphs Abs 1.2 1.0 - 3.6 K/uL   Monocytes Relative 6 %   Monocytes Absolute 0.4 0.2 - 1.0 K/uL   Eosinophils Relative 1 %   Eosinophils Absolute 0.1 0 - 0.7 K/uL   Basophils Relative 0 %   Basophils Absolute 0.0 0 - 0.1 K/uL  Basic metabolic panel  Result Value Ref Range   Sodium 134 (L) 135 - 145 mmol/L   Potassium 3.6 3.5 - 5.1 mmol/L   Chloride 101 101 - 111 mmol/L   CO2 23 22 - 32 mmol/L   Glucose, Bld 89 65 - 99 mg/dL   BUN 23 (H) 6 - 20 mg/dL  Creatinine, Ser 0.98 0.61 - 1.24 mg/dL   Calcium 9.4 8.9 - 16.1 mg/dL   GFR calc non Af Amer >60 >60 mL/min   GFR calc Af Amer >60 >60 mL/min   Anion gap 10 5 - 15  Protime-INR  Result Value Ref Range   Prothrombin Time 13.6 11.4 - 15.2 seconds   INR 1.05   APTT  Result Value Ref Range   aPTT 32 24 - 36 seconds  Urinalysis, Routine w reflex microscopic  Result Value Ref Range   Color, Urine YELLOW (A) YELLOW   APPearance HAZY (A) CLEAR   Specific Gravity, Urine 1.015 1.005 - 1.030   pH 6.0 5.0 - 8.0   Glucose, UA NEGATIVE NEGATIVE mg/dL   Hgb urine dipstick NEGATIVE NEGATIVE   Bilirubin Urine NEGATIVE NEGATIVE   Ketones, ur NEGATIVE NEGATIVE mg/dL   Protein, ur NEGATIVE NEGATIVE mg/dL   Nitrite NEGATIVE NEGATIVE   Leukocytes, UA TRACE (A) NEGATIVE   RBC / HPF 6-30 0 - 5 RBC/hpf   WBC, UA 0-5 0 - 5 WBC/hpf   Bacteria, UA NONE SEEN NONE SEEN   Squamous Epithelial / LPF NONE SEEN NONE SEEN    Mucus PRESENT   Type and screen [facility]  Result Value Ref Range   ABO/RH(D) A POS    Antibody Screen NEG    Sample Expiration 12/31/2017    Extend sample reason      NO TRANSFUSIONS OR PREGNANCY IN THE PAST 3 MONTHS Performed at [facility], [address] [place], [place] [address]       Assessment & Plan:   Problem List Items Addressed This Visit      Other   Restless leg syndrome    The current medical regimen is effective;  continue present plan and medications.       Depression    Discussed depression care and treatment and possibility of helping with pain will use Cymbaltaafter stopping Zoloft on day of surgery then restarting with Cymbalt 30 mg1 a day for a week then 60 mg recheck 1 month or so.      Relevant Medications   DULoxetine (CYMBALTA) 30 MG capsule   DULoxetine (CYMBALTA) 60 MG capsule       Follow up plan: Return in about 4 weeks (around 01/24/2018).

## 2017-12-27 NOTE — Assessment & Plan Note (Signed)
Discussed depression care and treatment and possibility of helping with pain will use Cymbaltaafter stopping Zoloft on day of surgery then restarting with Cymbalt 30 mg1 a day for a week then 60 mg recheck 1 month or so.

## 2017-12-27 NOTE — Assessment & Plan Note (Signed)
The current medical regimen is effective;  continue present plan and medications.  

## 2017-12-28 ENCOUNTER — Ambulatory Visit: Payer: BLUE CROSS/BLUE SHIELD | Admitting: Student in an Organized Health Care Education/Training Program

## 2017-12-28 NOTE — Progress Notes (Signed)
Pharmacy consult for antibiotic surgical prophylaxis  57 yo male Wt= 86.2 kg  Will order Cefazolin 2 gram IV x 1 for surgical prophylaxis  Bari MantisKristin Tayler Lassen PharmD Clinical Pharmacist 12/28/2017

## 2017-12-29 ENCOUNTER — Other Ambulatory Visit: Payer: Self-pay

## 2017-12-29 ENCOUNTER — Ambulatory Visit
Admission: RE | Admit: 2017-12-29 | Discharge: 2017-12-29 | Disposition: A | Discharge status: Home / Self Care | Payer: BLUE CROSS/BLUE SHIELD | Source: Ambulatory Visit | Attending: Neurosurgery | Admitting: Neurosurgery

## 2017-12-29 ENCOUNTER — Ambulatory Visit: Payer: BLUE CROSS/BLUE SHIELD | Admitting: Student in an Organized Health Care Education/Training Program

## 2017-12-29 ENCOUNTER — Ambulatory Visit: Payer: BLUE CROSS/BLUE SHIELD | Admitting: Anesthesiology

## 2017-12-29 ENCOUNTER — Ambulatory Visit: Payer: BLUE CROSS/BLUE SHIELD

## 2017-12-29 ENCOUNTER — Encounter
Admission: RE | Disposition: A | Discharge status: Home / Self Care | Payer: Self-pay | Source: Ambulatory Visit | Attending: Neurosurgery

## 2017-12-29 DIAGNOSIS — Z87891 Personal history of nicotine dependence: Secondary | ICD-10-CM | POA: Diagnosis not present

## 2017-12-29 DIAGNOSIS — G2581 Restless legs syndrome: Secondary | ICD-10-CM | POA: Diagnosis not present

## 2017-12-29 DIAGNOSIS — Z888 Allergy status to other drugs, medicaments and biological substances status: Secondary | ICD-10-CM | POA: Diagnosis not present

## 2017-12-29 DIAGNOSIS — M50123 Cervical disc disorder at C6-C7 level with radiculopathy: Secondary | ICD-10-CM | POA: Diagnosis not present

## 2017-12-29 DIAGNOSIS — M1990 Unspecified osteoarthritis, unspecified site: Secondary | ICD-10-CM | POA: Diagnosis not present

## 2017-12-29 DIAGNOSIS — Z820 Family history of epilepsy and other diseases of the nervous system: Secondary | ICD-10-CM | POA: Insufficient documentation

## 2017-12-29 DIAGNOSIS — Z79899 Other long term (current) drug therapy: Secondary | ICD-10-CM | POA: Insufficient documentation

## 2017-12-29 DIAGNOSIS — Z8249 Family history of ischemic heart disease and other diseases of the circulatory system: Secondary | ICD-10-CM | POA: Diagnosis not present

## 2017-12-29 DIAGNOSIS — Z9889 Other specified postprocedural states: Secondary | ICD-10-CM | POA: Insufficient documentation

## 2017-12-29 DIAGNOSIS — Z419 Encounter for procedure for purposes other than remedying health state, unspecified: Secondary | ICD-10-CM

## 2017-12-29 DIAGNOSIS — G4733 Obstructive sleep apnea (adult) (pediatric): Secondary | ICD-10-CM | POA: Diagnosis not present

## 2017-12-29 DIAGNOSIS — K219 Gastro-esophageal reflux disease without esophagitis: Secondary | ICD-10-CM | POA: Insufficient documentation

## 2017-12-29 DIAGNOSIS — I4891 Unspecified atrial fibrillation: Secondary | ICD-10-CM | POA: Insufficient documentation

## 2017-12-29 DIAGNOSIS — F419 Anxiety disorder, unspecified: Secondary | ICD-10-CM | POA: Insufficient documentation

## 2017-12-29 DIAGNOSIS — F329 Major depressive disorder, single episode, unspecified: Secondary | ICD-10-CM | POA: Insufficient documentation

## 2017-12-29 DIAGNOSIS — Z7982 Long term (current) use of aspirin: Secondary | ICD-10-CM | POA: Insufficient documentation

## 2017-12-29 DIAGNOSIS — M5412 Radiculopathy, cervical region: Secondary | ICD-10-CM | POA: Diagnosis present

## 2017-12-29 HISTORY — PX: CERVICAL DISC ARTHROPLASTY: SHX587

## 2017-12-29 LAB — ABO/RH: ABO/RH(D): A POS

## 2017-12-29 SURGERY — CERVICAL ANTERIOR DISC ARTHROPLASTY
Anesthesia: General | Site: Spine Cervical | Wound class: Clean

## 2017-12-29 MED ORDER — OXYCODONE HCL 5 MG PO TABS: 5.0000 mg | ORAL_TABLET | ORAL | 0 refills | Status: DC | PRN

## 2017-12-29 MED ORDER — MIDAZOLAM HCL 2 MG/2ML IJ SOLN
INTRAMUSCULAR | Status: DC | PRN
  Administered 2017-12-29: 2 mg via INTRAVENOUS

## 2017-12-29 MED ORDER — FENTANYL CITRATE (PF) 100 MCG/2ML IJ SOLN
INTRAMUSCULAR | Status: DC | PRN
  Administered 2017-12-29 (×5): 50 ug via INTRAVENOUS

## 2017-12-29 MED ORDER — CEFAZOLIN SODIUM-DEXTROSE 2-4 GM/100ML-% IV SOLN
2.0000 g | INTRAVENOUS | Status: AC
  Administered 2017-12-29: 2 g via INTRAVENOUS

## 2017-12-29 MED ORDER — FENTANYL CITRATE (PF) 100 MCG/2ML IJ SOLN
INTRAMUSCULAR | Status: AC
  Administered 2017-12-29: 25 ug via INTRAVENOUS
  Filled 2017-12-29: qty 2

## 2017-12-29 MED ORDER — ROCURONIUM BROMIDE 100 MG/10ML IV SOLN
INTRAVENOUS | Status: DC | PRN
  Administered 2017-12-29: 50 mg via INTRAVENOUS
  Administered 2017-12-29: 20 mg via INTRAVENOUS

## 2017-12-29 MED ORDER — ONDANSETRON HCL 4 MG/2ML IJ SOLN
INTRAMUSCULAR | Status: DC | PRN
  Administered 2017-12-29: 4 mg via INTRAVENOUS

## 2017-12-29 MED ORDER — OXYCODONE HCL 5 MG PO TABS: 5.0000 mg | ORAL_TABLET | Freq: Once | ORAL | Status: AC | PRN

## 2017-12-29 MED ORDER — SCOPOLAMINE 1 MG/3DAYS TD PT72
1.0000 | MEDICATED_PATCH | Freq: Once | TRANSDERMAL | Status: DC
  Administered 2017-12-29: 1.5 mg via TRANSDERMAL

## 2017-12-29 MED ORDER — GELATIN ABSORBABLE 12-7 MM EX MISC
CUTANEOUS | Status: DC | PRN
  Administered 2017-12-29: 1

## 2017-12-29 MED ORDER — FAMOTIDINE 20 MG PO TABS
20.0000 mg | ORAL_TABLET | Freq: Once | ORAL | Status: AC
  Administered 2017-12-29: 20 mg via ORAL

## 2017-12-29 MED ORDER — SODIUM CHLORIDE 0.9 % IR SOLN
Status: DC | PRN
  Administered 2017-12-29: 1000 mL

## 2017-12-29 MED ORDER — ONDANSETRON HCL 4 MG/2ML IJ SOLN
INTRAMUSCULAR | Status: AC
  Administered 2017-12-29: 4 mg
  Filled 2017-12-29: qty 2

## 2017-12-29 MED ORDER — OXYCODONE HCL 5 MG/5ML PO SOLN
ORAL | Status: AC
  Administered 2017-12-29: 5 mg via ORAL
  Filled 2017-12-29: qty 5

## 2017-12-29 MED ORDER — FENTANYL CITRATE (PF) 100 MCG/2ML IJ SOLN
25.0000 ug | INTRAMUSCULAR | Status: DC | PRN
  Administered 2017-12-29: 25 ug via INTRAVENOUS
  Administered 2017-12-29: 50 ug via INTRAVENOUS
  Administered 2017-12-29: 25 ug via INTRAVENOUS
  Administered 2017-12-29: 50 ug via INTRAVENOUS

## 2017-12-29 MED ORDER — PROPOFOL 10 MG/ML IV BOLUS
INTRAVENOUS | Status: DC | PRN
  Administered 2017-12-29: 150 mg via INTRAVENOUS

## 2017-12-29 MED ORDER — LIDOCAINE HCL (PF) 2 % IJ SOLN
INTRAMUSCULAR | Status: AC
  Filled 2017-12-29: qty 10

## 2017-12-29 MED ORDER — MIDAZOLAM HCL 2 MG/2ML IJ SOLN
INTRAMUSCULAR | Status: AC
  Filled 2017-12-29: qty 2

## 2017-12-29 MED ORDER — FAMOTIDINE 20 MG PO TABS
ORAL_TABLET | ORAL | Status: AC
  Administered 2017-12-29: 20 mg via ORAL
  Filled 2017-12-29: qty 1

## 2017-12-29 MED ORDER — GELATIN ABSORBABLE 12-7 MM EX MISC
CUTANEOUS | Status: AC
  Filled 2017-12-29: qty 1

## 2017-12-29 MED ORDER — FENTANYL CITRATE (PF) 100 MCG/2ML IJ SOLN
INTRAMUSCULAR | Status: AC
  Filled 2017-12-29: qty 2

## 2017-12-29 MED ORDER — THROMBIN (RECOMBINANT) 5000 UNITS EX SOLR
CUTANEOUS | Status: AC
  Filled 2017-12-29: qty 5000

## 2017-12-29 MED ORDER — MEPERIDINE HCL 50 MG/ML IJ SOLN: 6.2500 mg | INTRAMUSCULAR | Status: DC | PRN

## 2017-12-29 MED ORDER — CEFAZOLIN SODIUM-DEXTROSE 2-4 GM/100ML-% IV SOLN
INTRAVENOUS | Status: AC
  Filled 2017-12-29: qty 100

## 2017-12-29 MED ORDER — BUPIVACAINE-EPINEPHRINE (PF) 0.5% -1:200000 IJ SOLN
INTRAMUSCULAR | Status: AC
  Filled 2017-12-29: qty 30

## 2017-12-29 MED ORDER — SODIUM CHLORIDE FLUSH 0.9 % IV SOLN
INTRAVENOUS | Status: AC
  Filled 2017-12-29: qty 10

## 2017-12-29 MED ORDER — SUGAMMADEX SODIUM 200 MG/2ML IV SOLN
INTRAVENOUS | Status: DC | PRN
  Administered 2017-12-29: 200 mg via INTRAVENOUS

## 2017-12-29 MED ORDER — LIDOCAINE HCL (CARDIAC) 20 MG/ML IV SOLN
INTRAVENOUS | Status: DC | PRN
  Administered 2017-12-29: 100 mg via INTRAVENOUS

## 2017-12-29 MED ORDER — ROCURONIUM BROMIDE 50 MG/5ML IV SOLN
INTRAVENOUS | Status: AC
  Filled 2017-12-29: qty 1

## 2017-12-29 MED ORDER — PROMETHAZINE HCL 25 MG/ML IJ SOLN: 6.2500 mg | INTRAMUSCULAR | Status: DC | PRN

## 2017-12-29 MED ORDER — SCOPOLAMINE 1 MG/3DAYS TD PT72
MEDICATED_PATCH | TRANSDERMAL | Status: AC
  Administered 2017-12-29: 1.5 mg via TRANSDERMAL
  Filled 2017-12-29: qty 1

## 2017-12-29 MED ORDER — LACTATED RINGERS IV SOLN
INTRAVENOUS | Status: DC | PRN
  Administered 2017-12-29: 08:00:00 via INTRAVENOUS

## 2017-12-29 MED ORDER — OXYCODONE HCL 5 MG/5ML PO SOLN
5.0000 mg | Freq: Once | ORAL | Status: AC | PRN
  Administered 2017-12-29: 5 mg via ORAL

## 2017-12-29 MED ORDER — FENTANYL CITRATE (PF) 250 MCG/5ML IJ SOLN
INTRAMUSCULAR | Status: AC
  Filled 2017-12-29: qty 5

## 2017-12-29 MED ORDER — METHOCARBAMOL 500 MG PO TABS: 500.0000 mg | ORAL_TABLET | Freq: Four times a day (QID) | ORAL | 0 refills | Status: DC | PRN

## 2017-12-29 MED ORDER — BACITRACIN 50000 UNITS IM SOLR
INTRAMUSCULAR | Status: AC
  Filled 2017-12-29: qty 1

## 2017-12-29 MED ORDER — THROMBIN (RECOMBINANT) 5000 UNITS EX SOLR
CUTANEOUS | Status: DC | PRN
  Administered 2017-12-29: 5000 [IU] via TOPICAL

## 2017-12-29 MED ORDER — LACTATED RINGERS IV SOLN
INTRAVENOUS | Status: DC
  Administered 2017-12-29 (×2): via INTRAVENOUS

## 2017-12-29 MED ORDER — PROPOFOL 10 MG/ML IV BOLUS
INTRAVENOUS | Status: AC
  Filled 2017-12-29: qty 20

## 2017-12-29 MED ORDER — BUPIVACAINE-EPINEPHRINE (PF) 0.5% -1:200000 IJ SOLN
INTRAMUSCULAR | Status: DC | PRN
  Administered 2017-12-29: 7 mL

## 2017-12-29 SURGICAL SUPPLY — 57 items
14 MM DISTRACTION SCREW ×4 IMPLANT
BLADE BOVIE TIP EXT 4 (BLADE) ×2 IMPLANT
BLADE SURG 15 STRL LF DISP TIS (BLADE) ×1 IMPLANT
BLADE SURG 15 STRL SS (BLADE) ×1
BUR NEURO DRILL SOFT 3.0X3.8M (BURR) ×2 IMPLANT
CANISTER SUCT 1200ML W/VALVE (MISCELLANEOUS) ×4 IMPLANT
CHLORAPREP W/TINT 26ML (MISCELLANEOUS) ×2 IMPLANT
COUNTER NEEDLE 20/40 LG (NEEDLE) ×2 IMPLANT
COVER LIGHT HANDLE STERIS (MISCELLANEOUS) ×4 IMPLANT
CRADLE LAMINECT ARM (MISCELLANEOUS) ×2 IMPLANT
CUP MEDICINE 2OZ PLAST GRAD ST (MISCELLANEOUS) ×4 IMPLANT
DERMABOND ADVANCED (GAUZE/BANDAGES/DRESSINGS) ×1
DERMABOND ADVANCED .7 DNX12 (GAUZE/BANDAGES/DRESSINGS) ×1 IMPLANT
DISC MOBI-C CERVICAL 15X15 H6 (Neuro Prosthesis/Implant) ×2 IMPLANT
DISC MOBI-C CERVICAL 15X7X15 (Neuro Prosthesis/Implant) ×2 IMPLANT
DRAPE C-ARM 42X72 X-RAY (DRAPES) ×4 IMPLANT
DRAPE INCISE IOBAN 66X45 STRL (DRAPES) ×2 IMPLANT
DRAPE LAPAROTOMY 77X122 PED (DRAPES) ×2 IMPLANT
DRAPE MICROSCOPE SPINE 48X150 (DRAPES) ×2 IMPLANT
DRAPE POUCH INSTRU U-SHP 10X18 (DRAPES) ×2 IMPLANT
DRAPE SHEET LG 3/4 BI-LAMINATE (DRAPES) ×2 IMPLANT
DRAPE SURG 17X11 SM STRL (DRAPES) ×8 IMPLANT
DRAPE TABLE BACK 80X90 (DRAPES) ×2 IMPLANT
ELECT CAUTERY BLADE TIP 2.5 (TIP) ×2
ELECT REM PT RETURN 9FT ADLT (ELECTROSURGICAL) ×2
ELECTRODE CAUTERY BLDE TIP 2.5 (TIP) ×1 IMPLANT
ELECTRODE REM PT RTRN 9FT ADLT (ELECTROSURGICAL) ×1 IMPLANT
FEE INTRAOP MONITOR IMPULS NCS (MISCELLANEOUS) IMPLANT
FRAME EYE SHIELD (PROTECTIVE WEAR) ×2 IMPLANT
GLOVE SURG SYN 8.5  E (GLOVE) ×3
GLOVE SURG SYN 8.5 E (GLOVE) ×3 IMPLANT
GOWN SRG XL LVL 3 NONREINFORCE (GOWNS) ×1 IMPLANT
GOWN STRL NON-REIN TWL XL LVL3 (GOWNS) ×1
GOWN STRL REUS W/ TWL LRG LVL3 (GOWN DISPOSABLE) ×2 IMPLANT
GOWN STRL REUS W/TWL LRG LVL3 (GOWN DISPOSABLE) ×2
GRADUATE 1200CC STRL 31836 (MISCELLANEOUS) ×2 IMPLANT
INTRAOP MONITOR FEE IMPULS NCS (MISCELLANEOUS)
INTRAOP MONITOR FEE IMPULSE (MISCELLANEOUS)
IV CATH ANGIO 12GX3 LT BLUE (NEEDLE) ×2 IMPLANT
KIT RM TURNOVER STRD PROC AR (KITS) ×2 IMPLANT
MARKER SKIN DUAL TIP RULER LAB (MISCELLANEOUS) ×2 IMPLANT
NEEDLE HYPO 22GX1.5 SAFETY (NEEDLE) ×2 IMPLANT
NS IRRIG 1000ML POUR BTL (IV SOLUTION) ×2 IMPLANT
PACK LAMINECTOMY NEURO (CUSTOM PROCEDURE TRAY) ×2 IMPLANT
PIN CASPAR SPINAL 14MM CERVICA (PIN) ×4 IMPLANT
SPOGE SURGIFLO 8M (HEMOSTASIS) ×2
SPONGE KITTNER 5P (MISCELLANEOUS) ×2 IMPLANT
SPONGE SURGIFLO 8M (HEMOSTASIS) ×2 IMPLANT
STAPLER SKIN PROX 35W (STAPLE) ×2 IMPLANT
STRIP CLOSURE SKIN 1/2X4 (GAUZE/BANDAGES/DRESSINGS) IMPLANT
SUT V-LOC 90 ABS DVC 3-0 CL (SUTURE) ×2 IMPLANT
SUT VIC AB 3-0 SH 8-18 (SUTURE) ×2 IMPLANT
SYR 30ML LL (SYRINGE) ×2 IMPLANT
TAPE ADH 3 LX (MISCELLANEOUS) ×2 IMPLANT
TOWEL OR 17X26 4PK STRL BLUE (TOWEL DISPOSABLE) ×8 IMPLANT
TRAY FOLEY W/METER SILVER 16FR (SET/KITS/TRAYS/PACK) IMPLANT
TUBING CONNECTING 10 (TUBING) ×2 IMPLANT

## 2017-12-29 NOTE — Anesthesia Procedure Notes (Signed)
Procedure Name: Intubation Performed by: Philbert Riser, CRNA Pre-anesthesia Checklist: Patient identified, Emergency Drugs available, Suction available, Patient being monitored and Timeout performed Patient Re-evaluated:Patient Re-evaluated prior to induction Oxygen Delivery Method: Circle system utilized and Simple face mask Preoxygenation: Pre-oxygenation with 100% oxygen Induction Type: IV induction Ventilation: Mask ventilation without difficulty Laryngoscope Size: Mac and 3 Grade View: Grade I Tube type: Oral Tube size: 7.5 mm Number of attempts: 1 Airway Equipment and Method: Stylet

## 2017-12-29 NOTE — Discharge Instructions (Signed)
Your surgeon has performed an operation on your cervical spine (neck) to relieve pressure on the spinal cord and/or nerves. This involved making an incision in the front of your neck and removing one or more of the discs that support your spine. Next, a small piece of bone was used to fuse two or more of the vertebrae (bones) together.  The following are instructions to help in your recovery once you have been discharged from the hospital. Even if you feel well, it is important that you follow these activity guidelines. If you do not let your neck heal properly from the surgery, you can increase the chance of return of your symptoms and other complications.  * Do not take anti-inflammatory medications for 3 months after surgery (naproxen [Aleve], ibuprofen [Advil, Motrin], celecoxib [Celebrex], etc.). These medications can prevent your bones from healing properly.  Activity    No bending, lifting, or twisting (BLT). Avoid lifting objects heavier than 10 pounds (gallon milk jug).  Where possible, avoid household activities that involve lifting, bending, reaching, pushing, or pulling such as laundry, vacuuming, grocery shopping, and childcare. Try to arrange for help from friends and family for these activities while your back heals.  Increase physical activity slowly as tolerated.  Taking short walks is encouraged, but avoid strenuous exercise. Do not jog, run, bicycle, lift weights, or participate in any other exercises unless specifically allowed by your doctor.  Talk to your doctor before resuming sexual activity.  You should not drive until cleared by your doctor.  Until released by your doctor, you should not return to work or school.  You should rest at home and let your body heal.   You may shower three days after your surgery.  After showering, lightly dab your incision dry. Do not take a tub bath or go swimming until approved by your doctor at your follow-up appointment.  If your  doctor ordered a cervical collar (neck brace) for you, you should wear it whenever you are out of bed. You may remove it when lying down or sleeping, but you should wear it at all other times. Not all neck surgeries require a cervical collar.  If you smoke, we strongly recommend that you quit.  Smoking has been proven to interfere with normal bone healing and will dramatically reduce the success rate of your surgery. Please contact [facility] (800-QUIT-NOW) and use the resources at www.[facility].com for assistance in stopping smoking.  Surgical Incision   If you have a dressing on your incision, you may remove it two days after your surgery. Keep your incision area clean and dry.  If you have staples or stitches on your incision, you should have a follow up scheduled for removal. If you do not have staples or stitches, you will have steri-strips (small pieces of surgical tape) or Dermabond glue. The steri-strips/glue should begin to peel away within about a week (it is fine if the steri-strips fall off before then). If the strips are still in place one week after your surgery, you may gently remove them.  Diet           You may return to your usual diet. However, you may experience discomfort when swallowing in the first month after your surgery. This is normal. You may find that softer foods are more comfortable for you to swallow. Be sure to stay hydrated.  When to Contact [place]  You may experience pain in your neck and/or pain between your shoulder blades. This is normal and  should improve in the next few weeks with the help of pain medication, muscle relaxers, and rest. Some patients report that a warm compress on the back of the neck or between the shoulder blades helps.  However, should you experience any of the following, contact us immediately:  New numbness or weakness  Pain that is progressively getting worse, and is not relieved by your pain medication, muscle relaxers, rest, and warm  compresses  Bleeding, redness, swelling, pain, or drainage from surgical incision  Chills or flu-like symptoms  Fever greater than 101.0 F (38.3 C)  Inability to eat, drink fluids, or take medications  Problems with bowel or bladder functions  Difficulty breathing or shortness of breath  Warmth, tenderness, or swelling in your calf Contact Information  During office hours (Monday-Friday 9 am to 5 pm), please call your physician at [phone] and ask for Sharlot Gowda  After hours and weekends, please call the [facility] Operator at [phone] and ask for the Neurosurgery Resident On Call   For a life-threatening emergency, call 911

## 2017-12-29 NOTE — Anesthesia Procedure Notes (Signed)
Arterial Line Insertion Start/End1/16/2019 7:35 AM, 12/29/2017 7:42 AM Performed by: Alver FisherPenwarden, Amy, MD, anesthesiologist  Patient location: Pre-op. Preanesthetic checklist: patient identified, IV checked, site marked, risks and benefits discussed, surgical consent, monitors and equipment checked, pre-op evaluation, timeout performed and anesthesia consent Patient sedated radial was placed Catheter size: 20 Fr Hand hygiene performed , maximum sterile barriers used  and Seldinger technique used Allen's test indicative of satisfactory collateral circulation Attempts: 1 Procedure performed without using ultrasound guided technique. Following insertion, dressing applied. Post procedure assessment: normal and unchanged  Patient tolerated the procedure well with no immediate complications.

## 2017-12-29 NOTE — Anesthesia Postprocedure Evaluation (Signed)
Anesthesia Post Note  Patient: Aaron Watson  Procedure(s) Performed: CERVICAL ANTERIOR DISC ARTHROPLASTY-C5-7 (N/A Spine Cervical)  Patient location during evaluation: PACU Anesthesia Type: General Level of consciousness: awake and alert and oriented Pain management: pain level controlled Vital Signs Assessment: post-procedure vital signs reviewed and stable Respiratory status: spontaneous breathing, nonlabored ventilation and respiratory function stable Cardiovascular status: blood pressure returned to baseline and stable Postop Assessment: no signs of nausea or vomiting Anesthetic complications: no     Last Vitals:  Vitals:   12/29/17 1131 12/29/17 1145  BP: (!) 141/90 (!) 151/84  Pulse: 95 93  Resp: 12 14  Temp:  36.4 C  SpO2: 97% 96%    Last Pain:  Vitals:   12/29/17 1145  TempSrc: Temporal  PainSc: 4                  [name]

## 2017-12-29 NOTE — Anesthesia Preprocedure Evaluation (Signed)
Anesthesia Evaluation  Patient identified by MRN, date of birth, ID band Patient awake    Reviewed: Allergy & Precautions, NPO status , Patient's Chart, lab work & pertinent test results  History of Anesthesia Complications (+) PONV and history of anesthetic complications  Airway Mallampati: III  TM Distance: >3 FB Neck ROM: Full    Dental no notable dental hx.    Pulmonary sleep apnea and Continuous Positive Airway Pressure Ventilation , neg COPD, former smoker,    breath sounds clear to auscultation- rhonchi (-) wheezing      Cardiovascular Exercise Tolerance: Good (-) hypertension(-) CAD, (-) Past MI and (-) Cardiac Stents + dysrhythmias Atrial Fibrillation  Rhythm:Regular Rate:Normal - Systolic murmurs and - Diastolic murmurs    Neuro/Psych PSYCHIATRIC DISORDERS Anxiety Depression negative neurological ROS     GI/Hepatic Neg liver ROS, GERD  ,  Endo/Other  negative endocrine ROSneg diabetes  Renal/GU negative Renal ROS     Musculoskeletal  (+) Arthritis ,   Abdominal (+) - obese,   Peds  Hematology negative hematology ROS (+)   Anesthesia Other Findings Past Medical History: No date: A-fib (HCC) No date: Anxiety No date: Arthritis     Comment:  osteoarthritis No date: Depression No date: Dysrhythmia No date: GERD (gastroesophageal reflux disease) No date: Itching No date: OSA on CPAP No date: PONV (postoperative nausea and vomiting) No date: Restless leg syndrome No date: Sleep apnea   Reproductive/Obstetrics                             Anesthesia Physical Anesthesia Plan  ASA: III  Anesthesia Plan: General   Post-op Pain Management:    Induction: Intravenous  PONV Risk Score and Plan: 2 and Ondansetron, Dexamethasone, Scopolamine patch - Pre-op and Midazolam  Airway Management Planned: Oral ETT  Additional Equipment:   Intra-op Plan:   Post-operative Plan:  Extubation in OR  Informed Consent: I have reviewed the patients History and Physical, chart, labs and discussed the procedure including the risks, benefits and alternatives for the proposed anesthesia with the patient or authorized representative who has indicated his/her understanding and acceptance.   Dental advisory given  Plan Discussed with: CRNA and Anesthesiologist  Anesthesia Plan Comments:         Anesthesia Quick Evaluation

## 2017-12-29 NOTE — Transfer of Care (Signed)
Immediate Anesthesia Transfer of Care Note  Patient: Nolon LennertRussell W Artley III  Procedure(s) Performed: CERVICAL ANTERIOR DISC ARTHROPLASTY-C5-7 (N/A Spine Cervical)  Patient Location: PACU  Anesthesia Type:General  Level of Consciousness: awake  Airway & Oxygen Therapy: Patient Spontanous Breathing  Post-op Assessment: Report given to RN  Post vital signs: Reviewed and stable  Last Vitals:  Vitals:   12/29/17 0606 12/29/17 1033  BP: 128/88 (!) 161/84  Pulse: 72 87  Resp: 16 16  Temp: 36.6 C 36.6 C  SpO2: 99% 100%    Last Pain:  Vitals:   12/29/17 1033  TempSrc: Skin  PainSc:          Complications: No apparent anesthesia complications

## 2017-12-29 NOTE — Op Note (Signed)
Indications: [name] is a 57 yo male who presented with cervical radiculopathy with disc herniations at C5-6 and C6-7.  He failed conservative management and elected for surgical intervention.  Findings: cervical disc herniations C5-6 and C6-7  Preoperative Diagnosis: Cervical radiculopathy Postoperative Diagnosis: same   EBL: 50 ml IVF: 1500 ml Drains: none Disposition: Extubated and Stable to PACU Complications: none  No foley catheter was placed.   Preoperative Note:   Risks of surgery discussed include: infection, bleeding, stroke, coma, death, paralysis, CSF leak, nerve/spinal cord injury, numbness, tingling, weakness, complex regional pain syndrome, recurrent stenosis and/or disc herniation, vascular injury, development of instability, neck/back pain, need for further surgery, persistent symptoms, development of deformity, and the risks of anesthesia. They understood these risks and have agreed to proceed.  Operative Note:   Operative Note:  Procedure:  1) Cervical Disc Arthroplasty at C5/6 and C6/7 using a LDR Mobi-C device   Procedure: After obtaining informed consent, the patient taken to the operating room, placed in supine position, general anesthesia induced.  The patient had a small shoulder roll placed behind the neck.  The patient received preop antibiotics and IV Decadron.  The patient had a neck incision outlined, was prepped and draped in usual sterile fashion. The incision was injected with local anesthetic.   An incision was opened, dissection taken down medial to the carotid artery and jugular vein, lateral to the trachea and esophagus.  The prevertebral fascia identified and a localizing x-ray demonstrated the correct level.  The longus colli were dissected laterally, and self-retaining retractors placed to open the operative field. The microscope was then brought into the field.  With this complete, distractor pins were placed in the vertebral bodies of C5 and  C7. The distractor was placed, and the anulus at C5/6 was opened using a bovie.  Curettes and pituitary rongeurs used to remove the majority of disk, then the drill was used to remove the posterior osteophyte and begin the foraminotomies. The nerve hook was used to elevate the posterior longitudinal ligament, which was then removed with Kerrison rongeurs. The microblunt nerve hook could be passed out the foramen bilateral at each level.   Meticulous hemostasis was obtained.  A trial spacer was used to size the disc space. Using flouroscopic guidance, a 15 mm width x 15 mm depth x 5 mm height Mobi-C was then inserted in the prepared disc space.  The anulus at C6/7 was then opened using a bovie.  Curettes and pituitary rongeurs used to remove the majority of disk, then the drill was used to remove the posterior osteophyte and begin the foraminotomies. The nerve hook was used to elevate the posterior longitudinal ligament, which was then removed with Kerrison rongeurs. The microblunt nerve hook could be passed out the foramen bilateral at each level.   Meticulous hemostasis was obtained.  A trial spacer was used to size the disc space. Using flouroscopic guidance, a 15 mm width x 15 mm depth x 6 mm height Mobi-C was then inserted in the prepared disc space.  The caspar distractor was removed, and bone wax used for hemostasis. Final AP and lateral radiographs were taken.   With the disc arthroplasties in good position, the wound was irrigated copiously with bacitracin-containing solution and meticulous hemostasis obtained.  Wound was closed in 2 layers using interrupted inverted 3-0 Vicryl sutures.  The wound was dressed with dermabond, the head of bed at 30 degrees, taken to recovery room in stable condition.  No new postop  neurological deficits were identified.  Sponge and pattie counts were correct at the end of the procedure.   I performed the entire procedure with the assistance of Ivar Drape PA as an  Designer, television/film set.  Venetia Night MD

## 2017-12-29 NOTE — Anesthesia Post-op Follow-up Note (Signed)
Anesthesia QCDR form completed.        

## 2017-12-29 NOTE — Anesthesia Procedure Notes (Signed)
Performed by: Manuel Dall E, CRNA       

## 2017-12-29 NOTE — H&P (Signed)
Aaron Watson is an 57 y.o. male.   Chief Complaint: cervical radiculopathy HPI:  Aaron Watson is a 57 yo male with history of L cervical radiculopathy affecting the L C6 and C7 nerve roots.  He failed conservative management and elected for surgical intervention.  He has substantial spondylosis of the cervical spine and foraminal stenosis due to disc herniations at C5-6 and C6-7.   Past Medical History:  Diagnosis Date  . A-fib (HCC)   . Anxiety   . Arthritis    osteoarthritis  . Depression   . Dysrhythmia   . GERD (gastroesophageal reflux disease)   . Itching   . OSA on CPAP   . PONV (postoperative nausea and vomiting)   . Restless leg syndrome   . Sleep apnea     Past Surgical History:  Procedure Laterality Date  . CARDIAC CATHETERIZATION  2008   [facility]: 40% mid LAD lesion. Otherwise insignificant/minimal CAD.  Marland Kitchen NM MYOVIEW LTD  April 2008   Dr. Welton Flakes ([facility]) : Inferior wall defect, EF 55%. -- Suggested to the due to ischemia in RCA territory versus diaphragmatic attenuation normal EF.  Marland Kitchen NM MYOVIEW LTD  2013   Treadmill Myoview [facility]) no evidence of ischemia or infarction. Normal EF.  Marland Kitchen SHOULDER SURGERY Left   . TRANSTHORACIC ECHOCARDIOGRAM  July 20 13th; April 2016   At [facility]: a) normal LV function. Mild LA dilation, mild LVH and mild TR/MR. b) Normal LV size and function. EF 55-60%. No RW MA. Trivial MR and TR. Otherwise normal    Family History  Problem Relation Age of Onset  . Hypertension Mother   . Neuropathy Father   . Hypertension Father   . Heart attack Maternal Grandfather    Social History:  reports that he has quit smoking. His smoking use included cigarettes. He has a 5.00 pack-year smoking history. he has never used smokeless tobacco. He reports that he drinks alcohol. He reports that he does not use drugs.  Allergies:  Allergies  Allergen Reactions  . Other Other (See Comments)    STEROIDS CAUSES HICCUPS AND DISTURBS DIGESTIVE SYSTEM     Medications Prior to Admission  Medication Sig Dispense Refill  . acetaminophen (TYLENOL) 500 MG tablet Take 1,000 mg by mouth every 6 (six) hours as needed (for pain.).    Marland Kitchen Ascorbic Acid (C 500/ROSE HIPS PO) Take 500 mg by mouth daily.    Marland Kitchen aspirin EC 81 MG tablet Take 81 mg by mouth every evening.    . B Complex-C (SUPER B COMPLEX PO) Take 1 capsule by mouth daily.     . DULoxetine (CYMBALTA) 30 MG capsule Take 1 capsule (30 mg total) by mouth daily. 7 capsule 0  . flecainide (TAMBOCOR) 150 MG tablet Take 1 tablet (150 mg total) by mouth 2 (two) times daily. 60 tablet 0  . ibuprofen (ADVIL,MOTRIN) 200 MG tablet Take 400 mg by mouth every 8 (eight) hours as needed (for pain.).    Marland Kitchen L-ARGININE PO Take 1 capsule by mouth daily. L-Arginine with L-Citrulline    . pramipexole (MIRAPEX) 0.125 MG tablet Take 0.5 mg by mouth at bedtime. Take 4 tablets 2 hours before bedtime.     . Probiotic Product (PROBIOTIC PO) Take 1 capsule by mouth daily.     . Tadalafil 2.5 MG TABS Take 1 tablet (2.5 mg total) by mouth daily. 90 tablet 1  . valACYclovir (VALTREX) 500 MG tablet Take 1 tablet (500 mg total) by mouth  daily. 90 tablet 4  . DULoxetine (CYMBALTA) 60 MG capsule Take 1 capsule (60 mg total) by mouth daily. 30 capsule 3    No results found for this or any previous visit (from the past 48 hour(s)). No results found.  Review of Systems  All other systems reviewed and are negative.   Blood pressure 128/88, pulse 72, temperature 97.8 F (36.6 C), temperature source Tympanic, resp. rate 16, weight 86.2 kg (190 lb), SpO2 99 %. Physical Exam  AAOx3, CNI 5/5 t/o Heart sounds normal no MRG. Chest Clear to Auscultation Bilaterally.    Assessment/Plan 57 yo male with L cervical radiculopathy refractory to conservative maangement  - C5-6 and C6-7 cervical disc arthroplasty - risks and benefits reviewed  Venetia NightHESTER [name], MD 12/29/2017, 6:57 AM

## 2017-12-30 ENCOUNTER — Encounter: Payer: Self-pay | Admitting: Neurosurgery

## 2018-01-01 ENCOUNTER — Other Ambulatory Visit: Payer: Self-pay | Admitting: Family Medicine

## 2018-01-04 ENCOUNTER — Encounter: Payer: Self-pay | Admitting: Family Medicine

## 2018-01-04 MED ORDER — TAMSULOSIN HCL 0.4 MG PO CAPS: 0.4000 mg | ORAL_CAPSULE | Freq: Every day | ORAL | 3 refills | Status: DC

## 2018-01-04 NOTE — Telephone Encounter (Signed)
Phone call Discussed with patient urinary hesitancy biggest problem will call in tamsulosin to see if that helps if dysuria continues will need urine specimen to review.

## 2018-01-26 ENCOUNTER — Encounter: Payer: Self-pay | Admitting: Family Medicine

## 2018-01-26 ENCOUNTER — Ambulatory Visit (INDEPENDENT_AMBULATORY_CARE_PROVIDER_SITE_OTHER): Payer: BLUE CROSS/BLUE SHIELD | Admitting: Family Medicine

## 2018-01-26 DIAGNOSIS — N401 Enlarged prostate with lower urinary tract symptoms: Secondary | ICD-10-CM | POA: Diagnosis not present

## 2018-01-26 DIAGNOSIS — N400 Enlarged prostate without lower urinary tract symptoms: Secondary | ICD-10-CM | POA: Insufficient documentation

## 2018-01-26 DIAGNOSIS — S86911A Strain of unspecified muscle(s) and tendon(s) at lower leg level, right leg, initial encounter: Secondary | ICD-10-CM | POA: Insufficient documentation

## 2018-01-26 DIAGNOSIS — R3912 Poor urinary stream: Secondary | ICD-10-CM

## 2018-01-26 DIAGNOSIS — F3342 Major depressive disorder, recurrent, in full remission: Secondary | ICD-10-CM | POA: Diagnosis not present

## 2018-01-26 MED ORDER — DULOXETINE HCL 60 MG PO CPEP: 60.0000 mg | ORAL_CAPSULE | Freq: Every day | ORAL | 1 refills | Status: DC

## 2018-01-26 MED ORDER — TAMSULOSIN HCL 0.4 MG PO CAPS: 0.4000 mg | ORAL_CAPSULE | Freq: Every day | ORAL | 3 refills | Status: DC

## 2018-01-26 MED ORDER — TADALAFIL 2.5 MG PO TABS: 2.5000 mg | ORAL_TABLET | Freq: Every day | ORAL | 3 refills | Status: DC

## 2018-01-26 NOTE — Assessment & Plan Note (Signed)
Wants to continue tamsulosin for refills

## 2018-01-26 NOTE — Progress Notes (Signed)
   BP 120/77   Pulse 86   Wt 194 lb (88 kg)   SpO2 98%   BMI 26.68 kg/m    Subjective:    Patient ID: Aaron Watson, male    DOB: 01/20/1961, 57 y.o.   MRN: [id number]  HPI: Aaron Watson is a 57 y.o. male  Chief Complaint  Patient presents with  . Follow-up  . Knee Pain    Right, knee.    Patient follow-up Cymbalta and is had good success with medication was felt better with depression pain is also improved. No side effects patient. Tamsulosin working well.  Wants to continue that medication as its done very good. We will to refill on sialoliths also. Patient's knee some giving way no real clicking no swelling some tenderness in the right lateral knee no known strain or trauma. Relevant past medical, surgical, family and social history reviewed and updated as indicated. Interim medical history since our last visit reviewed. Allergies and medications reviewed and updated.  Review of Systems  Constitutional: Negative.   Respiratory: Negative.   Cardiovascular: Negative.     Per HPI unless specifically indicated above     Objective:    BP 120/77   Pulse 86   Wt 194 lb (88 kg)   SpO2 98%   BMI 26.68 kg/m   Wt Readings from Last 3 Encounters:  01/26/18 194 lb (88 kg)  12/29/17 190 lb (86.2 kg)  12/27/17 190 lb (86.2 kg)    Physical Exam  Constitutional: He is oriented to person, place, and time. He appears well-developed and well-nourished.  HENT:  Head: Normocephalic and atraumatic.  Eyes: Conjunctivae and EOM are normal.  Neck: Normal range of motion.  Cardiovascular: Normal rate, regular rhythm and normal heart sounds.  Pulmonary/Chest: Effort normal and breath sounds normal.  Musculoskeletal: Normal range of motion.  Right knee with full range of motion no clicking locking giving way no joint laxity some tenderness right lateral area.  Neurological: He is alert and oriented to person, place, and time.  Skin: No erythema.  Psychiatric: He has a  normal mood and affect. His behavior is normal. Judgment and thought content normal.    Results for orders placed or performed during the hospital encounter of 12/29/17  ABO/Rh  Result Value Ref Range   ABO/RH(D)      A POS Performed at [facility], [address] [place], [place] [address]       Assessment & Plan:   Problem List Items Addressed This Visit      Genitourinary   BPH (benign prostatic hyperplasia)    Wants to continue tamsulosin for refills      Relevant Medications   tamsulosin (FLOMAX) 0.4 MG CAPS capsule     Other   Depression    Doing well with medication no complaints will continue Cymbalta 60 mg      Relevant Medications   DULoxetine (CYMBALTA) 60 MG capsule   Knee strain, right, initial encounter    Discussed knee observe for now use meloxicam patient has some will check with his neck surgeon to make sure that is okay to take patient education given on knee care.          Follow up plan: Return in about 3 months (around 04/25/2018) for Physical Exam.

## 2018-01-26 NOTE — Assessment & Plan Note (Signed)
Doing well with medication no complaints will continue Cymbalta 60 mg

## 2018-01-26 NOTE — Assessment & Plan Note (Signed)
Discussed knee observe for now use meloxicam patient has some will check with his neck surgeon to make sure that is okay to take patient education given on knee care.

## 2018-04-21 ENCOUNTER — Encounter: Payer: Self-pay | Admitting: Family Medicine

## 2018-04-21 MED ORDER — BUSPIRONE HCL 10 MG PO TABS: 10.0000 mg | ORAL_TABLET | Freq: Three times a day (TID) | ORAL | 3 refills | Status: DC

## 2018-04-23 ENCOUNTER — Other Ambulatory Visit: Payer: Self-pay | Admitting: Family Medicine

## 2018-04-26 ENCOUNTER — Encounter: Payer: Self-pay | Admitting: Family Medicine

## 2018-04-26 ENCOUNTER — Other Ambulatory Visit: Payer: Self-pay

## 2018-04-26 MED ORDER — FLECAINIDE ACETATE 150 MG PO TABS: 150.0000 mg | ORAL_TABLET | Freq: Two times a day (BID) | ORAL | 3 refills | Status: DC

## 2018-04-29 ENCOUNTER — Encounter: Payer: Self-pay | Admitting: Unknown Physician Specialty

## 2018-04-29 ENCOUNTER — Ambulatory Visit (INDEPENDENT_AMBULATORY_CARE_PROVIDER_SITE_OTHER): Payer: BLUE CROSS/BLUE SHIELD | Admitting: Unknown Physician Specialty

## 2018-04-29 DIAGNOSIS — G2581 Restless legs syndrome: Secondary | ICD-10-CM | POA: Diagnosis not present

## 2018-04-29 DIAGNOSIS — F3342 Major depressive disorder, recurrent, in full remission: Secondary | ICD-10-CM

## 2018-04-29 MED ORDER — DULOXETINE HCL 30 MG PO CPEP: 30.0000 mg | ORAL_CAPSULE | Freq: Every day | ORAL | 1 refills | Status: DC

## 2018-04-29 NOTE — Assessment & Plan Note (Signed)
Taper down to 30 mg of Cymbalta and taper from there.  Discussed 5HTP/Fishoil as a supplement

## 2018-04-29 NOTE — Patient Instructions (Signed)
Depression - CBD, 5 HTP, Fish oil  Restless legs- Magnesium (Glycinate, Citrate), CBD, maybe Tumeric

## 2018-04-29 NOTE — Assessment & Plan Note (Signed)
Consider CBD oil and Magnesium supplements

## 2018-04-29 NOTE — Progress Notes (Signed)
BP 127/79   Pulse 80   Temp 98.8 F (37.1 C) (Oral)   Ht 5' 11.5" (1.816 m)   Wt 183 lb 6.4 oz (83.2 kg)   SpO2 98%   BMI 25.22 kg/m    Subjective:    Patient ID: Aaron Watson, male    DOB: 1961/10/31, 57 y.o.   MRN: [id number]  HPI: DAILY CRATE Watson is a 57 y.o. male  Chief Complaint  Patient presents with  . Medication Management    pt states he is here to discuss his medications   CC is that he would like to review his medication and get off as many as he can and consider holistic supplements to "make up" for what he is stopping.  He would like to "keep" Valtrex, flecainide, and Mirapex.    Depression Feels he is doing well and doesn't need this anymore.   Depression screen [facility] 2/9 04/29/2018 01/26/2018 12/27/2017 05/12/2017 04/27/2016  Decreased Interest 0 0 0 0 1  Down, Depressed, Hopeless 0 0 0 0 0  PHQ - 2 Score 0 0 0 0 1  Altered sleeping 1 - - - 2  Tired, decreased energy 1 - - - 2  Change in appetite 0 - - - 1  Feeling bad or failure about yourself  0 - - - 0  Trouble concentrating 0 - - - 1  Moving slowly or fidgety/restless 0 - - - 1  Suicidal thoughts 0 - - - 0  PHQ-9 Score 2 - - - 8    Relevant past medical, surgical, family and social history reviewed and updated as indicated. Interim medical history since our last visit reviewed. Allergies and medications reviewed and updated.  Review of Systems  Per HPI unless specifically indicated above     Objective:    BP 127/79   Pulse 80   Temp 98.8 F (37.1 C) (Oral)   Ht 5' 11.5" (1.816 m)   Wt 183 lb 6.4 oz (83.2 kg)   SpO2 98%   BMI 25.22 kg/m   Wt Readings from Last 3 Encounters:  04/29/18 183 lb 6.4 oz (83.2 kg)  01/26/18 194 lb (88 kg)  12/29/17 190 lb (86.2 kg)    Physical Exam  Constitutional: He is oriented to person, place, and time. He appears well-developed and well-nourished. No distress.  HENT:  Head: Normocephalic and atraumatic.  Eyes: Conjunctivae and lids are normal.  Right eye exhibits no discharge. Left eye exhibits no discharge. No scleral icterus.  Neck: Normal range of motion. Neck supple. No JVD present. Carotid bruit is not present.  Cardiovascular: Normal rate, regular rhythm and normal heart sounds.  Pulmonary/Chest: Effort normal and breath sounds normal. No respiratory distress.  Abdominal: Normal appearance. There is no splenomegaly or hepatomegaly.  Musculoskeletal: Normal range of motion.  Neurological: He is alert and oriented to person, place, and time.  Skin: Skin is warm, dry and intact. No rash noted. No pallor.  Psychiatric: He has a normal mood and affect. His behavior is normal. Judgment and thought content normal.    Results for orders placed or performed during the hospital encounter of 12/29/17  ABO/Rh  Result Value Ref Range   ABO/RH(D)      A POS Performed at [facility], [address], [place], [place] [address]       Assessment & Plan:   Problem List Items Addressed This Visit      Unprioritized   Depression  Taper down to 30 mg of Cymbalta and taper from there.  Discussed 5HTP/Fishoil as a supplement      Relevant Medications   DULoxetine (CYMBALTA) 30 MG capsule   Restless leg syndrome    Consider CBD oil and Magnesium supplements          Follow up plan: Return if symptoms worsen or fail to improve.

## 2018-05-12 ENCOUNTER — Encounter: Payer: Self-pay | Admitting: Unknown Physician Specialty

## 2018-05-16 ENCOUNTER — Encounter: Payer: Self-pay | Admitting: Family Medicine

## 2018-05-16 ENCOUNTER — Ambulatory Visit (INDEPENDENT_AMBULATORY_CARE_PROVIDER_SITE_OTHER): Payer: BLUE CROSS/BLUE SHIELD | Admitting: Family Medicine

## 2018-05-16 VITALS — BP 132/77 | HR 74 | Ht 69.5 in | Wt 184.0 lb

## 2018-05-16 DIAGNOSIS — E785 Hyperlipidemia, unspecified: Secondary | ICD-10-CM

## 2018-05-16 DIAGNOSIS — Z0000 Encounter for general adult medical examination without abnormal findings: Secondary | ICD-10-CM | POA: Diagnosis not present

## 2018-05-16 DIAGNOSIS — N401 Enlarged prostate with lower urinary tract symptoms: Secondary | ICD-10-CM | POA: Diagnosis not present

## 2018-05-16 DIAGNOSIS — Z125 Encounter for screening for malignant neoplasm of prostate: Secondary | ICD-10-CM

## 2018-05-16 DIAGNOSIS — R351 Nocturia: Secondary | ICD-10-CM

## 2018-05-16 DIAGNOSIS — E782 Mixed hyperlipidemia: Secondary | ICD-10-CM | POA: Diagnosis not present

## 2018-05-16 DIAGNOSIS — Z13220 Encounter for screening for lipoid disorders: Secondary | ICD-10-CM

## 2018-05-16 DIAGNOSIS — Z1329 Encounter for screening for other suspected endocrine disorder: Secondary | ICD-10-CM | POA: Diagnosis not present

## 2018-05-16 DIAGNOSIS — N410 Acute prostatitis: Secondary | ICD-10-CM | POA: Diagnosis not present

## 2018-05-16 DIAGNOSIS — N419 Inflammatory disease of prostate, unspecified: Secondary | ICD-10-CM | POA: Insufficient documentation

## 2018-05-16 LAB — URINALYSIS, ROUTINE W REFLEX MICROSCOPIC
BILIRUBIN UA: NEGATIVE
GLUCOSE, UA: NEGATIVE
Ketones, UA: NEGATIVE
Nitrite, UA: NEGATIVE
PROTEIN UA: NEGATIVE
SPEC GRAV UA: 1.01 (ref 1.005–1.030)
Urobilinogen, Ur: 0.2 mg/dL (ref 0.2–1.0)
pH, UA: 7 (ref 5.0–7.5)

## 2018-05-16 MED ORDER — SULFAMETHOXAZOLE-TRIMETHOPRIM 800-160 MG PO TABS: 1.0000 | ORAL_TABLET | Freq: Two times a day (BID) | ORAL | 0 refills | Status: DC

## 2018-05-16 MED ORDER — VALACYCLOVIR HCL 500 MG PO TABS: 500.0000 mg | ORAL_TABLET | Freq: Every day | ORAL | 4 refills | Status: DC

## 2018-05-16 NOTE — Progress Notes (Signed)
BP 132/77   Pulse 74   Ht 5' 9.5" (1.765 m)   Wt 184 lb (83.5 kg)   SpO2 98%   BMI 26.78 kg/m    Subjective:    Patient ID: Aaron Watson, male    DOB: [birth year], 57 y.o.   MRN: [id number]  HPI: Aaron Watson is a 57 y.o. male  Chief Complaint  Patient presents with  . Annual Exam  . Urinary issues    x 3 - 4 day, difficulty starting and stopping flow. Lower back pain. Discomfort w/ urination. Noticed blood in semen as well  Patient all in all doing okay except for some urinary issues.  Had some blood showing on urinalysis this morning with urine done at work indicating that her amount of blood urine done here showing only trace with microscope negative.  Patient's had some bloody ejaculate some pelvic discomfort and all in all just not doing well. Took finasteride and had dry mouth and just did not feel right and is not taking that for now. Reviewed other medications doing well taking flecainide without problems or cardiac events. Decrease Cymbalta dose to 30 is also doing well.  Relevant past medical, surgical, family and social history reviewed and updated as indicated. Interim medical history since our last visit reviewed. Allergies and medications reviewed and updated.  Review of Systems  Constitutional: Negative.   HENT: Negative.   Eyes: Negative.   Respiratory: Negative.   Cardiovascular: Negative.   Gastrointestinal: Negative.   Endocrine: Negative.   Genitourinary: Negative.   Musculoskeletal: Negative.   Skin: Negative.   Allergic/Immunologic: Negative.   Neurological: Negative.   Hematological: Negative.   Psychiatric/Behavioral: Negative.     Per HPI unless specifically indicated above     Objective:    BP 132/77   Pulse 74   Ht 5' 9.5" (1.765 m)   Wt 184 lb (83.5 kg)   SpO2 98%   BMI 26.78 kg/m   Wt Readings from Last 3 Encounters:  05/16/18 184 lb (83.5 kg)  04/29/18 183 lb 6.4 oz (83.2 kg)  01/26/18 194 lb (88 kg)    Physical  Exam  Constitutional: He is oriented to person, place, and time. He appears well-developed and well-nourished.  HENT:  Head: Normocephalic.  Right Ear: External ear normal.  Left Ear: External ear normal.  Nose: Nose normal.  Eyes: Pupils are equal, round, and reactive to light. Conjunctivae and EOM are normal.  Neck: Normal range of motion. Neck supple. No thyromegaly present.  Cardiovascular: Normal rate, regular rhythm, normal heart sounds and intact distal pulses.  Pulmonary/Chest: Effort normal and breath sounds normal.  Abdominal: Soft. Bowel sounds are normal. There is no splenomegaly or hepatomegaly.  Genitourinary: Penis normal.  Genitourinary Comments: Prostate exam deferred because of diagnosis of prostatitis and related pain with exam.  Musculoskeletal: Normal range of motion.  Lymphadenopathy:    He has no cervical adenopathy.  Neurological: He is alert and oriented to person, place, and time. He has normal reflexes.  Skin: Skin is warm and dry.  Psychiatric: He has a normal mood and affect. His behavior is normal. Judgment and thought content normal.    Results for orders placed or performed during the hospital encounter of 12/29/17  ABO/Rh  Result Value Ref Range   ABO/RH(D)      A POS Performed at [facility], [address] [place], [place] [address]       Assessment & Plan:  Problem List Items Addressed This Visit      Genitourinary   BPH (benign prostatic hyperplasia) - Primary   Relevant Orders   PSA   Prostatitis    As prostatitis care and treatment will start Septra for 1 month and observe response if prostate issues resolve will further review as needed if not will refer to urology.        Other   Hyperlipidemia   Relevant Orders   CBC with Differential/Platelet   Comprehensive metabolic panel   Lipid panel   Urinalysis, Routine w reflex microscopic   CBC with Differential/Platelet   Comprehensive metabolic panel   Lipid panel     Urinalysis, Routine w reflex microscopic    Other Visit Diagnoses    PE (physical exam), annual       Relevant Orders   CBC with Differential/Platelet   Comprehensive metabolic panel   Lipid panel   PSA   TSH   Urinalysis, Routine w reflex microscopic   Screening cholesterol level       Relevant Orders   Lipid panel   Prostate cancer screening       Relevant Orders   PSA   Thyroid disorder screen       Relevant Orders   TSH       Follow up plan: Return in about 2 months (around 07/16/2018) for 2 months prostate recheck and also prostate exam if necessary..Marland Kitchen

## 2018-05-16 NOTE — Assessment & Plan Note (Signed)
As prostatitis care and treatment will start Septra for 1 month and observe response if prostate issues resolve will further review as needed if not will refer to urology.

## 2018-05-17 ENCOUNTER — Encounter: Payer: Self-pay | Admitting: Family Medicine

## 2018-05-17 LAB — COMPREHENSIVE METABOLIC PANEL
A/G RATIO: 2 (ref 1.2–2.2)
ALBUMIN: 4.7 g/dL (ref 3.5–5.5)
ALK PHOS: 70 IU/L (ref 39–117)
ALT: 17 IU/L (ref 0–44)
AST: 15 IU/L (ref 0–40)
BILIRUBIN TOTAL: 0.7 mg/dL (ref 0.0–1.2)
BUN / CREAT RATIO: 18 (ref 9–20)
BUN: 19 mg/dL (ref 6–24)
CHLORIDE: 99 mmol/L (ref 96–106)
CO2: 22 mmol/L (ref 20–29)
Calcium: 9.5 mg/dL (ref 8.7–10.2)
Creatinine, Ser: 1.06 mg/dL (ref 0.76–1.27)
GFR calc non Af Amer: 79 mL/min/{1.73_m2} (ref >59)
GFR, EST AFRICAN AMERICAN: 91 mL/min/{1.73_m2} (ref >59)
GLOBULIN, TOTAL: 2.3 g/dL (ref 1.5–4.5)
GLUCOSE: 77 mg/dL (ref 65–99)
POTASSIUM: 4 mmol/L (ref 3.5–5.2)
SODIUM: 138 mmol/L (ref 134–144)
TOTAL PROTEIN: 7 g/dL (ref 6.0–8.5)

## 2018-05-17 LAB — CBC WITH DIFFERENTIAL/PLATELET
BASOS ABS: 0 10*3/uL (ref 0.0–0.2)
BASOS: 0 %
EOS (ABSOLUTE): 0.1 10*3/uL (ref 0.0–0.4)
Eos: 1 %
HEMOGLOBIN: 15.4 g/dL (ref 13.0–17.7)
Hematocrit: 44.8 % (ref 37.5–51.0)
Immature Grans (Abs): 0 10*3/uL (ref 0.0–0.1)
Immature Granulocytes: 0 %
LYMPHS ABS: 1.5 10*3/uL (ref 0.7–3.1)
Lymphs: 23 %
MCH: 31 pg (ref 26.6–33.0)
MCHC: 34.4 g/dL (ref 31.5–35.7)
MCV: 90 fL (ref 79–97)
MONOCYTES: 6 %
Monocytes Absolute: 0.4 10*3/uL (ref 0.1–0.9)
Neutrophils Absolute: 4.4 10*3/uL (ref 1.4–7.0)
Neutrophils: 70 %
Platelets: 281 10*3/uL (ref 150–450)
RBC: 4.97 x10E6/uL (ref 4.14–5.80)
RDW: 14.3 % (ref 12.3–15.4)
WBC: 6.4 10*3/uL (ref 3.4–10.8)

## 2018-05-17 LAB — LIPID PANEL
Chol/HDL Ratio: 5.2 ratio — ABNORMAL HIGH (ref 0.0–5.0)
Cholesterol, Total: 193 mg/dL (ref 100–199)
HDL: 37 mg/dL — AB (ref >39)
LDL CALC: 108 mg/dL — AB (ref 0–99)
TRIGLYCERIDES: 240 mg/dL — AB (ref 0–149)
VLDL CHOLESTEROL CAL: 48 mg/dL — AB (ref 5–40)

## 2018-05-17 LAB — PSA: Prostate Specific Ag, Serum: 2.3 ng/mL (ref 0.0–4.0)

## 2018-05-17 LAB — TSH: TSH: 1.99 u[IU]/mL (ref 0.450–4.500)

## 2018-07-01 ENCOUNTER — Encounter: Payer: Self-pay | Admitting: Family Medicine

## 2018-07-04 ENCOUNTER — Other Ambulatory Visit: Payer: Self-pay | Admitting: Family Medicine

## 2018-07-04 MED ORDER — TADALAFIL 2.5 MG PO TABS: 2.5000 mg | ORAL_TABLET | Freq: Every day | ORAL | 3 refills | Status: DC

## 2018-07-06 ENCOUNTER — Telehealth: Payer: Self-pay | Admitting: Family Medicine

## 2018-07-06 NOTE — Telephone Encounter (Signed)
Copied from CRM [id number]. Topic: Quick Communication - See Telephone Encounter >> Jul 06, 2018  3:17 PM Oneal GroutSebastian, Jennifer S wrote: CRM for notification. See Telephone encounter for: 07/06/18. Insurance only covered 4 tablets of Tadalafil 2.5 MG TABS, he is taking this for BPH. Could another dx code be tried? Patient would like to be contact with info.

## 2018-07-07 ENCOUNTER — Other Ambulatory Visit: Payer: Self-pay | Admitting: Family Medicine

## 2018-07-07 MED ORDER — TADALAFIL 5 MG PO TABS: 5.0000 mg | ORAL_TABLET | Freq: Every day | ORAL | 4 refills | Status: DC | PRN

## 2018-07-07 NOTE — Telephone Encounter (Signed)
Irving Burtonmily is calling from [employer] and states she was speaking with Sheilah MinsJada Fox in regards to this patient. They are wanting to know why it is a 2.5 tablet instead of 5. She would like a call back [phone]

## 2018-07-07 NOTE — Telephone Encounter (Signed)
Call pt I am not sure why 2.5 mg for your medication.  That must be the computer as I am not sure that dose was even available.  I have changed to 5 mg.

## 2018-07-07 NOTE — Telephone Encounter (Signed)
Prior authorization for tadalafil was initiated via [email or web address]. Key: [id number]  Called to notify patient. VM left.

## 2018-07-07 NOTE — Telephone Encounter (Signed)
Pt is aware PA has been initiated. Pt would like to make sure another dx code is use

## 2018-07-07 NOTE — Telephone Encounter (Signed)
Please advise.   [employer] wants to know why 2.5 instead of 5 mg?

## 2018-07-11 NOTE — Telephone Encounter (Signed)
Called and [employer] and requested 5 mg pa instead of 2.5

## 2018-07-13 NOTE — Telephone Encounter (Signed)
P.A. For tadalafil 5 mg was denied. Per [employer] [place] w/ must have a documented American [employer] Association Symptom Index (AUA-SI) score of >= 8. Please advise. Will scan denial into chart.

## 2018-07-13 NOTE — Telephone Encounter (Signed)
Will leave for provider review

## 2018-07-18 NOTE — Telephone Encounter (Signed)
Call pt Cialis will not be approved

## 2018-07-19 NOTE — Telephone Encounter (Signed)
Patient was transferred to provider for telephone conversation.   

## 2018-07-25 ENCOUNTER — Encounter: Payer: Self-pay | Admitting: Family Medicine

## 2018-07-25 ENCOUNTER — Ambulatory Visit (INDEPENDENT_AMBULATORY_CARE_PROVIDER_SITE_OTHER): Payer: BLUE CROSS/BLUE SHIELD | Admitting: Family Medicine

## 2018-07-25 VITALS — BP 128/84 | HR 63 | Wt 189.0 lb

## 2018-07-25 DIAGNOSIS — F3342 Major depressive disorder, recurrent, in full remission: Secondary | ICD-10-CM | POA: Diagnosis not present

## 2018-07-25 DIAGNOSIS — R3912 Poor urinary stream: Secondary | ICD-10-CM

## 2018-07-25 DIAGNOSIS — N401 Enlarged prostate with lower urinary tract symptoms: Secondary | ICD-10-CM

## 2018-07-25 DIAGNOSIS — I480 Paroxysmal atrial fibrillation: Secondary | ICD-10-CM | POA: Diagnosis not present

## 2018-07-25 DIAGNOSIS — N410 Acute prostatitis: Secondary | ICD-10-CM | POA: Diagnosis not present

## 2018-07-25 NOTE — Assessment & Plan Note (Signed)
The current medical regimen is effective;  continue present plan and medications.  

## 2018-07-25 NOTE — Progress Notes (Signed)
BP 128/84   Pulse 63   Wt 189 lb (85.7 kg)   SpO2 97%   BMI 27.51 kg/m    Subjective:    Patient ID: Aaron Watson, male    DOB: 12/18/1960, 57 y.o.   MRN: [id number]  HPI: Aaron Watson is a 57 y.o. male  Chief Complaint  Patient presents with  . Benign Prostatic Hypertrophy    2 month f/u  Patient with follow-up prostatitis symptoms has resolved and is back to feeling normal.  No further blood is been noticed in semen or urine. Back on sertraline which is doing well with good control. Has developed right great toe pain and x-ray reveals what sounds like a nonunion patient will probably need surgery in the near future.  This results and significant pain.  Relevant past medical, surgical, family and social history reviewed and updated as indicated. Interim medical history since our last visit reviewed. Allergies and medications reviewed and updated.  Review of Systems  Constitutional: Negative.   Respiratory: Negative.   Cardiovascular: Negative.     Per HPI unless specifically indicated above     Objective:    BP 128/84   Pulse 63   Wt 189 lb (85.7 kg)   SpO2 97%   BMI 27.51 kg/m   Wt Readings from Last 3 Encounters:  07/25/18 189 lb (85.7 kg)  05/16/18 184 lb (83.5 kg)  04/29/18 183 lb 6.4 oz (83.2 kg)    Physical Exam  Constitutional: He is oriented to person, place, and time. He appears well-developed and well-nourished.  HENT:  Head: Normocephalic and atraumatic.  Eyes: Conjunctivae and EOM are normal.  Neck: Normal range of motion.  Cardiovascular: Normal rate, regular rhythm and normal heart sounds.  Pulmonary/Chest: Effort normal and breath sounds normal.  Musculoskeletal: Normal range of motion.  Neurological: He is alert and oriented to person, place, and time.  Skin: No erythema.  Psychiatric: He has a normal mood and affect. His behavior is normal. Judgment and thought content normal.    Results for orders placed or performed in  visit on 05/16/18  CBC with Differential/Platelet  Result Value Ref Range   WBC 6.4 3.4 - 10.8 x10E3/uL   RBC 4.97 4.14 - 5.80 x10E6/uL   Hemoglobin 15.4 13.0 - 17.7 g/dL   Hematocrit 78.244.8 95.637.5 - 51.0 %   MCV 90 79 - 97 fL   MCH 31.0 26.6 - 33.0 pg   MCHC 34.4 31.5 - 35.7 g/dL   RDW 21.314.3 08.612.3 - 57.815.4 %   Platelets 281 150 - 450 x10E3/uL   Neutrophils 70 Not Estab. %   Lymphs 23 Not Estab. %   Monocytes 6 Not Estab. %   Eos 1 Not Estab. %   Basos 0 Not Estab. %   Neutrophils Absolute 4.4 1.4 - 7.0 x10E3/uL   Lymphocytes Absolute 1.5 0.7 - 3.1 x10E3/uL   Monocytes Absolute 0.4 0.1 - 0.9 x10E3/uL   EOS (ABSOLUTE) 0.1 0.0 - 0.4 x10E3/uL   Basophils Absolute 0.0 0.0 - 0.2 x10E3/uL   Immature Granulocytes 0 Not Estab. %   Immature Grans (Abs) 0.0 0.0 - 0.1 x10E3/uL  Comprehensive metabolic panel  Result Value Ref Range   Glucose 77 65 - 99 mg/dL   BUN 19 6 - 24 mg/dL   Creatinine, Ser 4.691.06 0.76 - 1.27 mg/dL   GFR calc non Af Amer 79 >59 mL/min/1.73   GFR calc Af Amer 91 >59 mL/min/1.73   BUN/Creatinine Ratio  18 9 - 20   Sodium 138 134 - 144 mmol/L   Potassium 4.0 3.5 - 5.2 mmol/L   Chloride 99 96 - 106 mmol/L   CO2 22 20 - 29 mmol/L   Calcium 9.5 8.7 - 10.2 mg/dL   Total Protein 7.0 6.0 - 8.5 g/dL   Albumin 4.7 3.5 - 5.5 g/dL   Globulin, Total 2.3 1.5 - 4.5 g/dL   Albumin/Globulin Ratio 2.0 1.2 - 2.2   Bilirubin Total 0.7 0.0 - 1.2 mg/dL   Alkaline Phosphatase 70 39 - 117 IU/L   AST 15 0 - 40 IU/L   ALT 17 0 - 44 IU/L  Lipid panel  Result Value Ref Range   Cholesterol, Total 193 100 - 199 mg/dL   Triglycerides 409240 (H) 0 - 149 mg/dL   HDL 37 (L) >81>39 mg/dL   VLDL Cholesterol Cal 48 (H) 5 - 40 mg/dL   LDL Calculated 191108 (H) 0 - 99 mg/dL   Chol/HDL Ratio 5.2 (H) 0.0 - 5.0 ratio  PSA  Result Value Ref Range   Prostate Specific Ag, Serum 2.3 0.0 - 4.0 ng/mL  TSH  Result Value Ref Range   TSH 1.990 0.450 - 4.500 uIU/mL  Urinalysis, Routine w reflex microscopic  Result  Value Ref Range   Specific Gravity, UA 1.010 1.005 - 1.030   pH, UA 7.0 5.0 - 7.5   Color, UA Yellow Yellow   Appearance Ur Clear Clear   Leukocytes, UA Trace (A) Negative   Protein, UA Negative Negative/Trace   Glucose, UA Negative Negative   Ketones, UA Negative Negative   RBC, UA Trace (A) Negative   Bilirubin, UA Negative Negative   Urobilinogen, Ur 0.2 0.2 - 1.0 mg/dL   Nitrite, UA Negative Negative      Assessment & Plan:   Problem List Items Addressed This Visit      Cardiovascular and Mediastinum   Paroxysmal atrial fibrillation (HCC)    The current medical regimen is effective;  continue present plan and medications.         Genitourinary   BPH (benign prostatic hyperplasia) - Primary    The current medical regimen is effective;  continue present plan and medications. a      Relevant Orders   PSA   Prostatitis   Relevant Orders   PSA     Other   Depression    The current medical regimen is effective;  continue present plan and medications.       Relevant Medications   sertraline (ZOLOFT) 100 MG tablet       Follow up plan: Return for Physical Exam,  June.

## 2018-07-25 NOTE — Assessment & Plan Note (Signed)
The current medical regimen is effective;  continue present plan and medications. a 

## 2018-07-26 ENCOUNTER — Encounter: Payer: Self-pay | Admitting: Family Medicine

## 2018-07-26 LAB — PSA: Prostate Specific Ag, Serum: 2.1 ng/mL (ref 0.0–4.0)

## 2018-08-11 ENCOUNTER — Telehealth: Payer: Self-pay | Admitting: Family Medicine

## 2018-08-11 MED ORDER — SERTRALINE HCL 100 MG PO TABS: 100.0000 mg | ORAL_TABLET | Freq: Every day | ORAL | 0 refills | Status: DC

## 2018-08-11 NOTE — Telephone Encounter (Signed)
Copied from CRM [id number]. Topic: Quick Communication - Rx Refill/Question >> Aug 11, 2018 12:45 PM Baldo DaubAlexander, Amber L wrote: Medication: sertraline (ZOLOFT) 100 MG tablet  Has the patient contacted their pharmacy? Yes - states no refills left (Agent: If no, request that the patient contact the pharmacy for the refill.) (Agent: If yes, when and what did the pharmacy advise?)  Preferred Pharmacy (with phone number or street name): [id number] [place], [place] - [address] AT [facility] [address] [phone] (Phone) [phone] (Fax)  Agent: Please be advised that RX refills may take up to 3 business days. We ask that you follow-up with your pharmacy.

## 2018-08-24 ENCOUNTER — Ambulatory Visit (INDEPENDENT_AMBULATORY_CARE_PROVIDER_SITE_OTHER): Payer: BLUE CROSS/BLUE SHIELD

## 2018-08-24 ENCOUNTER — Encounter: Payer: Self-pay | Admitting: Podiatry

## 2018-08-24 ENCOUNTER — Ambulatory Visit (INDEPENDENT_AMBULATORY_CARE_PROVIDER_SITE_OTHER): Payer: BLUE CROSS/BLUE SHIELD | Admitting: Podiatry

## 2018-08-24 DIAGNOSIS — M2042 Other hammer toe(s) (acquired), left foot: Secondary | ICD-10-CM | POA: Diagnosis not present

## 2018-08-24 DIAGNOSIS — M205X1 Other deformities of toe(s) (acquired), right foot: Secondary | ICD-10-CM | POA: Diagnosis not present

## 2018-08-24 DIAGNOSIS — M2041 Other hammer toe(s) (acquired), right foot: Secondary | ICD-10-CM

## 2018-08-24 NOTE — Patient Instructions (Signed)
Pre-Operative Instructions  Congratulations, you have decided to take an important step towards improving your quality of life.  You can be assured that the doctors and staff at [facility] will be with you every step of the way.  Here are some important things you should know:  1. Plan to be at the surgery center/hospital at least 1 (one) hour prior to your scheduled time, unless otherwise directed by the surgical center/hospital staff.  You must have a responsible adult accompany you, remain during the surgery and drive you home.  Make sure you have directions to the surgical center/hospital to ensure you arrive on time. 2. If you are having surgery at [facility] or [facility], you will need a copy of your medical history and physical form from your family physician within one month prior to the date of surgery. We will give you a form for your primary physician to complete.  3. We make every effort to accommodate the date you request for surgery.  However, there are times where surgery dates or times have to be moved.  We will contact you as soon as possible if a change in schedule is required.   4. No aspirin/ibuprofen for one week before surgery.  If you are on aspirin, any non-steroidal anti-inflammatory medications (Mobic, Aleve, Ibuprofen) should not be taken seven (7) days prior to your surgery.  You make take Tylenol for pain prior to surgery.  5. Medications - If you are taking daily heart and blood pressure medications, seizure, reflux, allergy, asthma, anxiety, pain or diabetes medications, make sure you notify the surgery center/hospital before the day of surgery so they can tell you which medications you should take or avoid the day of surgery. 6. No food or drink after midnight the night before surgery unless directed otherwise by surgical center/hospital staff. 7. No alcoholic beverages 24-hours prior to surgery.  No smoking 24-hours prior or 24-hours after  surgery. 8. Wear loose pants or shorts. They should be loose enough to fit over bandages, boots, and casts. 9. Don't wear slip-on shoes. Sneakers are preferred. 10. Bring your boot with you to the surgery center/hospital.  Also bring crutches or a walker if your physician has prescribed it for you.  If you do not have this equipment, it will be provided for you after surgery. 11. If you have not been contacted by the surgery center/hospital by the day before your surgery, call to confirm the date and time of your surgery. 12. Leave-time from work may vary depending on the type of surgery you have.  Appropriate arrangements should be made prior to surgery with your employer. 13. Prescriptions will be provided immediately following surgery by your doctor.  Fill these as soon as possible after surgery and take the medication as directed. Pain medications will not be refilled on weekends and must be approved by the doctor. 14. Remove nail polish on the operative foot and avoid getting pedicures prior to surgery. 15. Wash the night before surgery.  The night before surgery wash the foot and leg well with water and the antibacterial soap provided. Be sure to pay special attention to beneath the toenails and in between the toes.  Wash for at least three (3) minutes. Rinse thoroughly with water and dry well with a towel.  Perform this wash unless told not to do so by your physician.  Enclosed: 1 Ice pack (please put in freezer the night before surgery)   1 Hibiclens skin cleaner     Pre-op instructions  If you have any questions regarding the instructions, please do not hesitate to call our office.  [place]: [address], [place], [place] [address] -- [phone]  [place]: [address] [place], [place] [address] -- [phone]  [place]: [address]  [place], [place] [address] -- [phone]  [place]: [address], [address], [place], [place] [address] -- [phone]  Website:  [email or web address] 

## 2018-08-24 NOTE — Progress Notes (Signed)
He presents today primarily concerned about the first metatarsophalangeal joint of his right foot.  States that he really needs to be able to be back at work and understands that working on the fifth toe and the fifth metatarsals may take longer than putting a joint implant in.  States that is getting to the point where he can no longer enjoy the activities or work.  Objective: Vital signs are stable alert and oriented x3.  Pulses are palpable.  Neurologic sensorium is intact.  Degenerative flexors are intact.  He has pain on end range of motion of the first metatarsophalangeal joint of the right foot with restricted range of motion.  Mild tailor's bunion deformities bilateral with mild adductovarus rotated hammertoe deformities are noted bilateral.  Radiographs taken today compared to previous radiographs demonstrate dorsal spurring of the first metatarsophalangeal joint eccentric joint space narrowing and a joint mouse.  He also has had bunion deformities bilateral with increase in the fourth intermetatarsal angle and abduction of the fifth toe.  Assessment: Hallux limitus first metatarsophalangeal joint right foot.  Tenderness bunion deformities with mild hammertoe deformities fifth bilateral.  Plan: Discussed etiology pathology conservative versus surgical therapies.  At this point we discussed a Keller arthroplasty with a single silicone implant right foot.  He understands this and is amenable to it.  We discussed this in great detail.  We discussed the possible side effects which may include but are not limited to postop pain bleeding swelling infection recurrence need for further surgery overcorrection under correction of digit loss of limb loss of life and possibly the inability to perform all of his regular activities.  He understands this and is amenable to it.  We dispensed literature and information regarding preop necessities as well as information regarding the surgical center and anesthesia  group.  Dispensed a Cam walker and I will follow-up with him in the near future for surgical intervention.

## 2018-08-25 ENCOUNTER — Telehealth: Payer: Self-pay | Admitting: *Deleted

## 2018-08-25 NOTE — Telephone Encounter (Signed)
"  I called and left you a message this morning.  I'm a patient of Dr. Milinda Pointer.  I'm calling you to set up my surgery."  Do you have a date in mind?  "I'd like to do it on October 3."  Dr. Milinda Pointer doesn't have anything available on that date.  Well, what about the following week or November 1?"  He does not have anything available on either of those dates.  He can do it on November 8.  "Okay, go ahead and put me down for that date.  I'll still be good, my deductible has been met so I'm good until the end of this year.  Will you or someone check my insurance and take care of everything and give me a call?"  I will make sure it's authorized.  If I have any problems, I'll let you know.  You need to go online and register with the surgical center on [employer].  Instructions on how to do that are in the brochure that was given to you.

## 2018-08-29 ENCOUNTER — Telehealth: Payer: Self-pay | Admitting: *Deleted

## 2018-08-29 NOTE — Telephone Encounter (Signed)
"  I saw Dr. Al CorpusHyatt yesterday.  I'm supposed to call you to coordinate my surgery.  If you can, give me a call back."  Patient was scheduled for September 15, 2018.

## 2018-09-01 ENCOUNTER — Telehealth: Payer: Self-pay | Admitting: Cardiovascular Disease

## 2018-09-01 ENCOUNTER — Other Ambulatory Visit: Payer: Self-pay | Admitting: *Deleted

## 2018-09-01 MED ORDER — FLECAINIDE ACETATE 150 MG PO TABS: 150.0000 mg | ORAL_TABLET | Freq: Two times a day (BID) | ORAL | 0 refills | Status: DC

## 2018-09-01 NOTE — Telephone Encounter (Signed)
°*  STAT* If patient is at the pharmacy, call can be transferred to refill team.   1. Which medications need to be refilled? (please list name of each medication and dose if known) Flecainide   2. Which pharmacy/location (including street and city if local pharmacy) is medication to be sent to?[place] [facility]   3. Do they need a 30 day or 90 day supply? 30 day

## 2018-09-01 NOTE — Telephone Encounter (Signed)
[facility] # 30 R#0.

## 2018-09-02 ENCOUNTER — Other Ambulatory Visit: Payer: Self-pay

## 2018-09-02 NOTE — Telephone Encounter (Signed)
Patient calling States that pharmacy has not received medication refill for Flecainide Patient has scheduled an appointment for 10/28 with R. Dunn  States that [facility] pharmacy fax is [phone] Patient states pharmacy will be closing at 1p today and will need this medication ASAP

## 2018-09-02 NOTE — Telephone Encounter (Signed)
Spoke with patient needed Flecainide 150 mg twice a day  sent to [facility] [address] [id number]  Spoke with Pharmacist refilled Flecainide pharmacy had  50 mg on hand. Patient instructions take 3 tablets twice a day 180 tablets no refills.  Called [facility] [address] [place] and canceled previous e-prescribed order for Flecainide 150 BID

## 2018-09-07 ENCOUNTER — Other Ambulatory Visit: Payer: Self-pay | Admitting: Family Medicine

## 2018-09-12 ENCOUNTER — Telehealth: Payer: Self-pay | Admitting: *Deleted

## 2018-09-12 NOTE — Telephone Encounter (Signed)
"  I'm scheduled to have surgery in November.  Dr. Al Corpus told me if I wanted to add a procedure to let him know.  So I'm calling to let him know that I do."  You need to schedule an appointment to come in for another consult with Dr. Al Corpus.  "I have already seen him for a consultation.  He's told me to call if I wanted to add this procedure."  Yes, you need to see him again so he can update your consent form and get you to sign them again.  "Oh okay, I have to sign consents again." Would you like me to transfer you to an appointment scheduler?  "Sure, that will be fine."  I transferred him to [employer].

## 2018-09-14 ENCOUNTER — Ambulatory Visit: Payer: BLUE CROSS/BLUE SHIELD | Admitting: Podiatry

## 2018-09-22 ENCOUNTER — Ambulatory Visit: Payer: BLUE CROSS/BLUE SHIELD | Admitting: Podiatry

## 2018-10-08 ENCOUNTER — Encounter: Payer: Self-pay | Admitting: Physician Assistant

## 2018-10-08 NOTE — Progress Notes (Signed)
Cardiology Office Note Date:  10/10/2018  Patient ID:  Aaron Watson [birth year], MRN [id number] PCP:  Aaron Sizer, MD  Cardiologist:  Dr. Kirke Corin, MD    Chief Complaint: Follow up  History of Present Illness: Aaron Watson is a 56 y.o. male with history of nonobstructive CAD by LHC in 2008, persistent Afib on flecainide, OSA now off CPAP with weight loss as below, and RLS who presents for follow up of his Afib.    He was initially diagnosed with Afib in 2008 and has been maintaining sinus rhythm with flecainide since. He has not been on long term, full dose anticoagulation given his CHADS2VASc is 0. He had a LHC in 2008 that showed no evidence of obstructive CAD with 40% mid LAD stenosis. Treadmill Myoview in 2013 showed no evidence of ischemia with a normal EF. Echo from 03/2015 showed normal LVSF, trivial MR, and no evidence of pulmonary hypertension with a normal left atrial size. He was last seen in the office in 03/2017 for 12 month follow up and was doing well. He was noted to have severely elevated TG in 2017 at 569 and had since improved with diet significantly and lost ~ 20 pounds at that time. With this, he was able to come off CPAP without issues. He underwent cervical spine fusion in 12/2017.    Most recent labs from 05/2018: TC 193, TG 240, HDL 37, LDL 108, TSH 1.99, CMET unremarkable, CBC unremarkable   Patient comes in today doing well.  He has not had any symptoms concerning for angina, shortness of breath, palpitations, dizziness, diaphoresis, nausea, vomiting, presyncope, or syncope.  He does note an occasional abdominal fullness/bloating.  In this setting, he notes some increased constipation.  He has not had any falls since he was last seen.  No BRBPR or melena.  Tolerating all medications.  He is having bilateral foot surgery on 11/8.  He does not have any issues or concerns today.  Past Medical History:  Diagnosis Date  . Anxiety   . Arthritis    osteoarthritis  . Depression   . GERD (gastroesophageal reflux disease)   . Itching   . OSA on CPAP   . Persistent atrial fibrillation    a. CHADS2ASc 0; b. flecainide   . PONV (postoperative nausea and vomiting)   . Restless leg syndrome     Past Surgical History:  Procedure Laterality Date  . CARDIAC CATHETERIZATION  2008   [facility]: 40% mid LAD lesion. Otherwise insignificant/minimal CAD.  Marland Kitchen CERVICAL DISC ARTHROPLASTY N/A 12/29/2017   Procedure: CERVICAL ANTERIOR DISC ARTHROPLASTY-C5-7;  Surgeon: Venetia Night, MD;  Location: [facility] ORS;  Service: Neurosurgery;  Laterality: N/A;  . NM MYOVIEW LTD  April 2008   Dr. Welton Flakes ([facility]) : Inferior wall defect, EF 55%. -- Suggested to the due to ischemia in RCA territory versus diaphragmatic attenuation normal EF.  Marland Kitchen NM MYOVIEW LTD  2013   Treadmill Myoview [facility]) no evidence of ischemia or infarction. Normal EF.  Marland Kitchen SHOULDER SURGERY Left   . TRANSTHORACIC ECHOCARDIOGRAM  July 20 13th; April 2016   At [facility]: a) normal LV function. Mild LA dilation, mild LVH and mild TR/MR. b) Normal LV size and function. EF 55-60%. No RW MA. Trivial MR and TR. Otherwise normal    Current Meds  Medication Sig  . acetaminophen (TYLENOL) 500 MG tablet Take 1,000 mg by mouth every 6 (six) hours as needed (for pain.).  Marland Kitchen Ascorbic Acid (  C 500/ROSE HIPS PO) Take 500 mg by mouth daily.  . B Complex-C (SUPER B COMPLEX PO) Take 1 capsule by mouth daily.   . flecainide (TAMBOCOR) 150 MG tablet Take 1 tablet (150 mg total) by mouth 2 (two) times daily.  Marland Kitchen ibuprofen (ADVIL,MOTRIN) 200 MG tablet Take 400 mg by mouth every 8 (eight) hours as needed (for pain.).  Marland Kitchen pramipexole (MIRAPEX) 0.125 MG tablet Take 0.5 mg by mouth at bedtime. Take 4 tablets 2 hours before bedtime.   . Probiotic Product (PROBIOTIC PO) Take 1 capsule by mouth daily.   . sertraline (ZOLOFT) 100 MG tablet TAKE 1 TABLET(100 MG) BY MOUTH DAILY  . tadalafil (CIALIS) 5 MG tablet Take 1  tablet (5 mg total) by mouth daily as needed for erectile dysfunction.  . valACYclovir (VALTREX) 500 MG tablet Take 1 tablet (500 mg total) by mouth daily.    Allergies:   Other and Prednisone   Social History:  The patient  reports that he has quit smoking. His smoking use included cigarettes. He has a 5.00 pack-year smoking history. He has never used smokeless tobacco. He reports that he drinks alcohol. He reports that he does not use drugs.   Family History:  The patient's family history includes Heart attack in his maternal grandfather; Hypertension in his father and mother; Neuropathy in his father.  ROS:   Review of Systems  Constitutional: Negative for chills, diaphoresis, fever, malaise/fatigue and weight loss.  HENT: Negative for congestion.   Eyes: Negative for discharge and redness.  Respiratory: Negative for cough, hemoptysis, sputum production, shortness of breath and wheezing.   Cardiovascular: Negative for chest pain, palpitations, orthopnea, claudication, leg swelling and PND.  Gastrointestinal: Negative for abdominal pain, blood in stool, heartburn, melena, nausea and vomiting.  Genitourinary: Negative for hematuria.  Musculoskeletal: Negative for falls and myalgias.  Skin: Negative for rash.  Neurological: Negative for dizziness, tingling, tremors, sensory change, speech change, focal weakness, loss of consciousness and weakness.  Endo/Heme/Allergies: Does not bruise/bleed easily.  Psychiatric/Behavioral: Negative for substance abuse. The patient is not nervous/anxious.   All other systems reviewed and are negative.    PHYSICAL EXAM:  VS:  BP 132/72 (BP Location: Left Arm, Patient Position: Sitting, Cuff Size: Normal)   Pulse 71   Ht 5\' 11"  (1.803 m)   Wt 186 lb 8 oz (84.6 kg)   BMI 26.01 kg/m  BMI: Body mass index is 26.01 kg/m.  Physical Exam  Constitutional: He is oriented to person, place, and time. He appears well-developed and well-nourished.  HENT:    Head: Normocephalic and atraumatic.  Eyes: Right eye exhibits no discharge. Left eye exhibits no discharge.  Neck: Normal range of motion. No JVD present.  Cardiovascular: Normal rate, regular rhythm, S1 normal, S2 normal and normal heart sounds. Exam reveals no distant heart sounds, no friction rub, no midsystolic click and no opening snap.  No murmur heard. Pulses:      Posterior tibial pulses are 2+ on the right side, and 2+ on the left side.  Pulmonary/Chest: Effort normal and breath sounds normal. No respiratory distress. He has no decreased breath sounds. He has no wheezes. He has no rales. He exhibits no tenderness.  Abdominal: Soft. He exhibits no distension. There is no tenderness.  Musculoskeletal: He exhibits no edema.  Neurological: He is alert and oriented to person, place, and time.  Skin: Skin is warm and dry. No cyanosis. Nails show no clubbing.  Psychiatric: He has a normal mood and  affect. His speech is normal and behavior is normal. Judgment and thought content normal.     EKG:  Was ordered and interpreted by me today. Shows NSR, 71 bpm, incomplete RBBB (unchanged from prior)  Recent Labs: 05/16/2018: ALT 17; BUN 19; Creatinine, Ser 1.06; Hemoglobin 15.4; Platelets 281; Potassium 4.0; Sodium 138; TSH 1.990  05/16/2018: Chol/HDL Ratio 5.2; Cholesterol, Total 193; HDL 37; LDL Calculated 108; Triglycerides 240   CrCl cannot be calculated (Patient's most recent lab result is older than the maximum 21 days allowed.).   Wt Readings from Last 3 Encounters:  10/10/18 186 lb 8 oz (84.6 kg)  07/25/18 189 lb (85.7 kg)  05/16/18 184 lb (83.5 kg)     Other studies reviewed: Additional studies/records reviewed today include: summarized above  ASSESSMENT AND PLAN:  1. Persistent Afib: He remains without evidence of recurrent atrial ectopy or arrhythmia on flecainide 150 mg twice daily.  He is not on long-term full dose oral anticoagulation given his CHADS2VASC is 0.  Check  echocardiogram given he remains on flecainide and in the setting of the patient having upcoming bilateral foot surgery the following week.  Should patient's echocardiogram demonstrate preserved LV systolic function he will be low to moderate risk for noncardiac surgery without any need for further ischemic work-up.  2. CAD involving native coronary arteries without angina: Prior cardiac catheterization in 2008 demonstrate a 40% LAD stenosis.  He continues to be without anginal symptoms.  Continue risk factor modification and primary prevention  3. HLD: Most recent LDL 108 as above. Now off Crestor. Managed by PCP.   4. Sleep apnea: Not requiring CPAP.   Disposition: F/u with Dr. Kirke Corin or an APP in 12 months.    Current medicines are reviewed at length with the patient today.  The patient did not have any concerns regarding medicines.  Signed, Eula Listen, PA-C 10/10/2018 3:37 PM     [facility] [place] [address] [place], [place] [address] [phone]

## 2018-10-10 ENCOUNTER — Ambulatory Visit (INDEPENDENT_AMBULATORY_CARE_PROVIDER_SITE_OTHER): Payer: BLUE CROSS/BLUE SHIELD | Admitting: Physician Assistant

## 2018-10-10 ENCOUNTER — Telehealth: Payer: Self-pay | Admitting: *Deleted

## 2018-10-10 ENCOUNTER — Other Ambulatory Visit: Payer: Self-pay | Admitting: Family Medicine

## 2018-10-10 ENCOUNTER — Encounter: Payer: Self-pay | Admitting: Physician Assistant

## 2018-10-10 ENCOUNTER — Ambulatory Visit (INDEPENDENT_AMBULATORY_CARE_PROVIDER_SITE_OTHER): Payer: BLUE CROSS/BLUE SHIELD | Admitting: Podiatry

## 2018-10-10 VITALS — BP 132/72 | HR 71 | Ht 71.0 in | Wt 186.5 lb

## 2018-10-10 DIAGNOSIS — Z79899 Other long term (current) drug therapy: Secondary | ICD-10-CM | POA: Diagnosis not present

## 2018-10-10 DIAGNOSIS — M2041 Other hammer toe(s) (acquired), right foot: Secondary | ICD-10-CM | POA: Diagnosis not present

## 2018-10-10 DIAGNOSIS — I2510 Atherosclerotic heart disease of native coronary artery without angina pectoris: Secondary | ICD-10-CM | POA: Diagnosis not present

## 2018-10-10 DIAGNOSIS — Z01810 Encounter for preprocedural cardiovascular examination: Secondary | ICD-10-CM

## 2018-10-10 DIAGNOSIS — E782 Mixed hyperlipidemia: Secondary | ICD-10-CM

## 2018-10-10 DIAGNOSIS — M2042 Other hammer toe(s) (acquired), left foot: Secondary | ICD-10-CM | POA: Diagnosis not present

## 2018-10-10 DIAGNOSIS — I4819 Other persistent atrial fibrillation: Secondary | ICD-10-CM

## 2018-10-10 NOTE — Patient Instructions (Signed)
Medication Instructions:  Your physician recommends that you continue on your current medications as directed. Please refer to the Current Medication list given to you today.  If you need a refill on your cardiac medications before your next appointment, please call your pharmacy.   Lab work: none If you have labs (blood work) drawn today and your tests are completely normal, you will receive your results only by: Marland Kitchen MyChart Message (if you have MyChart) OR . A paper copy in the mail If you have any lab test that is abnormal or we need to change your treatment, we will call you to review the results.  Testing/Procedures: Your physician has requested that you have an echocardiogram. Echocardiography is a painless test that uses sound waves to create images of your heart. It provides your doctor with information about the size and shape of your heart and how well your heart's chambers and valves are working. This procedure takes approximately one hour. There are no restrictions for this procedure. You may get an IV, if needed, to receive an ultrasound enhancing agent through to better visualize your heart.     Follow-Up: At [facility], you and your health needs are our priority.  As part of our continuing mission to provide you with exceptional heart care, we have created designated Provider Care Teams.  These Care Teams include your primary Cardiologist (physician) and Advanced Practice Providers (APPs -  Physician Assistants and Nurse Practitioners) who all work together to provide you with the care you need, when you need it. You will need a follow up appointment in 12 months.  Please call our office 2 months in advance to schedule this appointment.  You may see  Dr Lorine Bears or one of the following Advanced Practice Providers on your designated Care Team:   Nicolasa Ducking, NP Eula Listen, PA-C . Marisue Ivan, PA-C    Echocardiogram An echocardiogram, or echocardiography,  uses sound waves (ultrasound) to produce an image of your heart. The echocardiogram is simple, painless, obtained within a short period of time, and offers valuable information to your health care provider. The images from an echocardiogram can provide information such as:  Evidence of coronary artery disease (CAD).  Heart size.  Heart muscle function.  Heart valve function.  Aneurysm detection.  Evidence of a past heart attack.  Fluid buildup around the heart.  Heart muscle thickening.  Assess heart valve function.  Tell a health care provider about:  Any allergies you have.  All medicines you are taking, including vitamins, herbs, eye drops, creams, and over-the-counter medicines.  Any problems you or family members have had with anesthetic medicines.  Any blood disorders you have.  Any surgeries you have had.  Any medical conditions you have.  Whether you are pregnant or may be pregnant. What happens before the procedure? No special preparation is needed. Eat and drink normally. What happens during the procedure?  In order to produce an image of your heart, gel will be applied to your chest and a wand-like tool (transducer) will be moved over your chest. The gel will help transmit the sound waves from the transducer. The sound waves will harmlessly bounce off your heart to allow the heart images to be captured in real-time motion. These images will then be recorded.  You may need an IV to receive a medicine that improves the quality of the pictures. What happens after the procedure? You may return to your normal schedule including diet, activities, and medicines, unless  your health care provider tells you otherwise. This information is not intended to replace advice given to you by your health care provider. Make sure you discuss any questions you have with your health care provider. Document Released: 11/27/2000 Document Revised: 07/18/2016 Document Reviewed:  08/07/2013 Elsevier Interactive Patient Education  2017 [employer].

## 2018-10-10 NOTE — Telephone Encounter (Signed)
Requested Prescriptions  Pending Prescriptions Disp Refills  . sertraline (ZOLOFT) 100 MG tablet [Pharmacy Med Name: SERTRALINE 100MG  TABLETS] 90 tablet 2    Sig: TAKE 1 TABLET(100 MG) BY MOUTH DAILY     Psychiatry:  Antidepressants - SSRI Passed - 10/10/2018  6:20 AM      Passed - Completed PHQ-2 or PHQ-9 in the last 360 days.      Passed - Valid encounter within last 6 months    Recent Outpatient Visits          2 months ago Benign prostatic hyperplasia with weak urinary stream   [facility] Crissman, Redge Gainer, MD   4 months ago Benign prostatic hyperplasia with nocturia   [facility] Crissman, Redge Gainer, MD   5 months ago Restless leg syndrome   [facility] Gabriel Cirri, NP   8 months ago Recurrent major depressive disorder, in full remission [facility])   [facility] Crissman, Redge Gainer, MD   9 months ago Recurrent major depressive disorder, in full remission [facility])   [facility] Crissman, Redge Gainer, MD      Future Appointments            In 8 months Crissman, Redge Gainer, MD [facility]

## 2018-10-10 NOTE — Telephone Encounter (Signed)
"  I have two questions I want to ask you.  Am I still on for Friday, November 8?"  Yes, you are scheduled for November 8.  "I assume everything been processed with my insurance for the surgery."  I have not processed it yet but it will be taken care of.  "The surgery is next week."  What insurance do you have?  "I have [place].  I have met my deductible and my out of pocket."  [employer] does not usually require authorization.  If you have met your deductible and out of pocket, they will cover your surgery at 100%.  "That's what I thought.  Just ignore my previous message."

## 2018-10-11 ENCOUNTER — Encounter: Payer: Self-pay | Admitting: *Deleted

## 2018-10-11 ENCOUNTER — Telehealth: Payer: Self-pay | Admitting: Cardiovascular Disease

## 2018-10-11 NOTE — Telephone Encounter (Signed)
FORWARD TO Alycia Rossetti DUNN PA PATIENT WAS SEEN YESTERDAY 10/10/18 FOR OFFICE APPOINTMENT

## 2018-10-11 NOTE — Telephone Encounter (Signed)
Please let performing provider's office we will await formal echo being done on 11/1 and fax over risk stratification following MD read.

## 2018-10-11 NOTE — Telephone Encounter (Signed)
   [facility] Pre-operative Risk Assessment    Request for surgical clearance:  1. What type of surgery is being performed? Keller Bunion implant of the right foot, Hammer Toe Repair, 5 digit bilateral, and Tailors Bunionectomy  2. When is this surgery scheduled? .not listed   3. What type of clearance is required (medical clearance vs. Pharmacy clearance to hold med vs. Both)? medical  4. Are there any medications that need to be held prior to surgery and how long? Not listed  5. Practice name and name of physician performing surgery?  [facility], Dr. Roselind Messier  6. What is your office phone number [phone]   7.   What is your office fax number [phone]  8.   Anesthesia type (None, local, MAC, general) ? general   Aaron Watson 10/11/2018, 3:15 PM  _________________________________________________________________   (provider comments below)

## 2018-10-11 NOTE — Progress Notes (Signed)
Per Dr. Al Corpus, I sent a medical clearance letter to Dr. Kirke Corin for clearance for surgery.

## 2018-10-11 NOTE — Progress Notes (Signed)
He presents today to add an addendum to his surgery that he has coming up.  He is also complaining of pain to the fifth metatarsals on the fifth toes bilaterally he states that he would like to add this to his already consented Keller arthroplasty with a single silicone implant.  Objective: Vital signs are stable alert and oriented x3.  Pulses are palpable.  Neurologic sensorium is intact.  Degenerative flexors are intact.  Muscle strength is normal symmetrical.  Tailor's bunion deformities are noted bilaterally are tender and painful on palpation as well as range of motion has adductovarus rotated hammertoe deformities fifth bilateral and the course still severe pain on palpation and attempted range of motion of the first metatarsophalangeal joint of the right foot.  Assessment: Hallux limitus hallux rigidus first metatarsal phalangeal joint right.  Tenderness bunion deformities bilateral hammertoe deformity fifth bilateral.  Plan: Discussed etiology pathology conservative or surgical therapies at this point once again consented him for a Keller arthroplasty with a single silicone implant first metatarsophalangeal joint of the right foot fifth metatarsal osteotomies with double screw fixation bilateral derotational arthroplasties fifth toes bilateral.  We did discuss the time out of work being at least 12 weeks also discussed the possibility of complications which may include but not limited to postop pain bleeding swelling infection recurrence need further surgery overcorrection under correction loss of digit loss of limb loss of life.

## 2018-10-11 NOTE — Telephone Encounter (Signed)
Routing to Dr Al Corpus to notify him through Epic.

## 2018-10-12 NOTE — Telephone Encounter (Signed)
Did you get this?  He needs echo prior to surgery.

## 2018-10-14 ENCOUNTER — Other Ambulatory Visit: Payer: BLUE CROSS/BLUE SHIELD

## 2018-10-20 ENCOUNTER — Other Ambulatory Visit: Payer: Self-pay | Admitting: Podiatry

## 2018-10-20 MED ORDER — OXYCODONE-ACETAMINOPHEN 10-325 MG PO TABS: 1.0000 | ORAL_TABLET | ORAL | 0 refills | Status: AC | PRN

## 2018-10-20 MED ORDER — CEPHALEXIN 500 MG PO CAPS: 500.0000 mg | ORAL_CAPSULE | Freq: Three times a day (TID) | ORAL | 0 refills | Status: DC

## 2018-10-20 MED ORDER — ONDANSETRON HCL 4 MG PO TABS: 4.0000 mg | ORAL_TABLET | Freq: Three times a day (TID) | ORAL | 0 refills | Status: DC | PRN

## 2018-10-21 ENCOUNTER — Encounter: Payer: Self-pay | Admitting: Podiatry

## 2018-10-21 DIAGNOSIS — M2042 Other hammer toe(s) (acquired), left foot: Secondary | ICD-10-CM | POA: Diagnosis not present

## 2018-10-21 DIAGNOSIS — M2041 Other hammer toe(s) (acquired), right foot: Secondary | ICD-10-CM | POA: Diagnosis not present

## 2018-10-21 DIAGNOSIS — M2011 Hallux valgus (acquired), right foot: Secondary | ICD-10-CM | POA: Diagnosis not present

## 2018-10-21 DIAGNOSIS — M21542 Acquired clubfoot, left foot: Secondary | ICD-10-CM | POA: Diagnosis not present

## 2018-10-21 DIAGNOSIS — M21541 Acquired clubfoot, right foot: Secondary | ICD-10-CM | POA: Diagnosis not present

## 2018-10-24 ENCOUNTER — Telehealth: Payer: Self-pay | Admitting: Podiatry

## 2018-10-24 NOTE — Telephone Encounter (Signed)
Pt called again to follow up from his call this morning. He had surgery last week and his toes are bruised/purple. He is concerned if this is normal or not. Please call patient.

## 2018-10-24 NOTE — Telephone Encounter (Addendum)
I returned patient call, he was concerned with his toes bruising.  I informed him that this was normal.  I educated him on s/s of infection, and instructed to keep elevated and iced as much as he can and keep scheduled appt in 2 days.   He verbalized understanding

## 2018-10-24 NOTE — Telephone Encounter (Signed)
Pt called and stated had surgery last week and his toes are now purple/bruised. Pt wants to know if this is normal and if not what should be done.

## 2018-10-25 ENCOUNTER — Telehealth: Payer: Self-pay | Admitting: *Deleted

## 2018-10-25 NOTE — Telephone Encounter (Signed)
"  I'm calling from [facility].  I'm calling to see when Aaron Watson had surgery and get the name of the procedures."  He had surgery on October 21, 2018.  He had a Advertising account executive, Metatarsal Osteotomy 5th bilateral, and Hammer Toe Repair 5th bilateral.  "When is his next appointment?"  His next appointment is November 13.

## 2018-10-26 ENCOUNTER — Encounter: Payer: Self-pay | Admitting: Podiatry

## 2018-10-26 ENCOUNTER — Ambulatory Visit (INDEPENDENT_AMBULATORY_CARE_PROVIDER_SITE_OTHER): Payer: BLUE CROSS/BLUE SHIELD

## 2018-10-26 ENCOUNTER — Other Ambulatory Visit: Payer: Self-pay | Admitting: Podiatry

## 2018-10-26 ENCOUNTER — Ambulatory Visit (INDEPENDENT_AMBULATORY_CARE_PROVIDER_SITE_OTHER): Payer: BLUE CROSS/BLUE SHIELD | Admitting: Podiatry

## 2018-10-26 DIAGNOSIS — M2042 Other hammer toe(s) (acquired), left foot: Secondary | ICD-10-CM | POA: Diagnosis not present

## 2018-10-26 DIAGNOSIS — M21621 Bunionette of right foot: Secondary | ICD-10-CM

## 2018-10-26 DIAGNOSIS — M205X1 Other deformities of toe(s) (acquired), right foot: Secondary | ICD-10-CM

## 2018-10-26 DIAGNOSIS — Z9889 Other specified postprocedural states: Secondary | ICD-10-CM

## 2018-10-26 DIAGNOSIS — M2041 Other hammer toe(s) (acquired), right foot: Secondary | ICD-10-CM

## 2018-10-26 DIAGNOSIS — M21622 Bunionette of left foot: Secondary | ICD-10-CM

## 2018-10-26 MED ORDER — TRAMADOL HCL 50 MG PO TABS: 50.0000 mg | ORAL_TABLET | Freq: Four times a day (QID) | ORAL | 0 refills | Status: AC | PRN

## 2018-10-26 NOTE — Progress Notes (Signed)
He presents today for his first postop visit date of surgery 10/21/2018 status post Lorenz CoasterKeller bunionectomy right foot fifth metatarsal osteotomies fifth hammertoe repairs bilaterally.  He states that they hurt so bad.  He states that he has not been taking his pain medicine because he does not like the side effects of it is only been taken at nighttime to help him rest.  Otherwise he states that he has been adhering to the 15-minute rule keeping his feet elevated 45 minutes of the hour only walking on his feet for 15 minutes or having them held down for 15 minutes.  He denies fever chills nausea vomiting muscle aches and pains.  Objective: Dry sterile dressing intact once removed demonstrates no erythematous mild edema no cellulitis drainage odor sutures are intact margins well coapted.  Radiographs taken today do demonstrate a well-healing Keller arthroplasty with a single silicone implant and fifth metatarsal osteotomy with a single screw fixation.  Assessment: Well-healing surgical foot bilateral.  Plan: Gust etiology pathology conservative therapies.  Redressed the foot today dressed a compressive dressing encouraged him to keep this dry and elevated encourage range of motion exercises bilateral foot.  Also prescribed him tramadol 50 mg 1 p.o. every 6 hours with ibuprofen or Tylenol.  I will follow-up with him next Wednesday the 20th at 415.

## 2018-11-02 ENCOUNTER — Encounter: Payer: Self-pay | Admitting: Podiatry

## 2018-11-02 ENCOUNTER — Ambulatory Visit (INDEPENDENT_AMBULATORY_CARE_PROVIDER_SITE_OTHER): Payer: BLUE CROSS/BLUE SHIELD | Admitting: Podiatry

## 2018-11-02 VITALS — BP 144/92 | HR 76 | Temp 98.2°F

## 2018-11-02 DIAGNOSIS — Z9889 Other specified postprocedural states: Secondary | ICD-10-CM

## 2018-11-02 DIAGNOSIS — M205X1 Other deformities of toe(s) (acquired), right foot: Secondary | ICD-10-CM

## 2018-11-02 DIAGNOSIS — M21622 Bunionette of left foot: Secondary | ICD-10-CM

## 2018-11-02 DIAGNOSIS — M21621 Bunionette of right foot: Secondary | ICD-10-CM

## 2018-11-02 DIAGNOSIS — M2042 Other hammer toe(s) (acquired), left foot: Secondary | ICD-10-CM | POA: Diagnosis not present

## 2018-11-02 DIAGNOSIS — M2041 Other hammer toe(s) (acquired), right foot: Secondary | ICD-10-CM

## 2018-11-02 NOTE — Progress Notes (Signed)
He presents today nearly 2 weeks status post Keller arthroplasty single silicone implant right as well as fifth metatarsal osteotomies bilateral and a hammertoe repair fifth bilateral he denies fever chills nausea vomiting muscle aches pain states that he seems to be doing pretty well.  Objective: Vital signs are stable alert and oriented x3.  Sutures are intact margins well coapted.  No erythema edema saline strange odor good range of motion of the first metatarsophalangeal joint.  Assessment: Well-healing surgical foot bilateral.  Plan: Removal of the sutures today margins remain well coapted placed in compression anklet's bilateral Darco shoes follow-up with him in 3 weeks.  Encourage range of motion exercises.

## 2018-11-03 ENCOUNTER — Other Ambulatory Visit: Payer: Self-pay

## 2018-11-03 MED ORDER — FLECAINIDE ACETATE 150 MG PO TABS: 150.0000 mg | ORAL_TABLET | Freq: Two times a day (BID) | ORAL | 1 refills | Status: DC

## 2018-11-03 NOTE — Telephone Encounter (Signed)
*  STAT* If patient is at the pharmacy, call can be transferred to refill team.   1. Which medications need to be refilled? (please list name of each medication and dose if known) Flecainide  2. Which pharmacy/location (including street and city if local pharmacy) is medication to be sent to? [facility] [place]  3. Do they need a 30 day or 90 day supply? 90

## 2018-11-16 ENCOUNTER — Encounter: Payer: Self-pay | Admitting: Podiatry

## 2018-11-16 ENCOUNTER — Ambulatory Visit (INDEPENDENT_AMBULATORY_CARE_PROVIDER_SITE_OTHER): Payer: BLUE CROSS/BLUE SHIELD

## 2018-11-16 ENCOUNTER — Ambulatory Visit (INDEPENDENT_AMBULATORY_CARE_PROVIDER_SITE_OTHER): Payer: BLUE CROSS/BLUE SHIELD | Admitting: Podiatry

## 2018-11-16 DIAGNOSIS — M21622 Bunionette of left foot: Secondary | ICD-10-CM

## 2018-11-16 DIAGNOSIS — M2042 Other hammer toe(s) (acquired), left foot: Secondary | ICD-10-CM

## 2018-11-16 DIAGNOSIS — M21621 Bunionette of right foot: Secondary | ICD-10-CM

## 2018-11-16 DIAGNOSIS — M2041 Other hammer toe(s) (acquired), right foot: Secondary | ICD-10-CM

## 2018-11-16 DIAGNOSIS — Z9889 Other specified postprocedural states: Secondary | ICD-10-CM

## 2018-11-16 DIAGNOSIS — M205X1 Other deformities of toe(s) (acquired), right foot: Secondary | ICD-10-CM | POA: Diagnosis not present

## 2018-11-16 MED ORDER — TRAMADOL HCL 50 MG PO TABS: 50.0000 mg | ORAL_TABLET | Freq: Four times a day (QID) | ORAL | 0 refills | Status: DC | PRN

## 2018-11-16 MED ORDER — TRAMADOL HCL 50 MG PO TABS: 50.0000 mg | ORAL_TABLET | Freq: Four times a day (QID) | ORAL | 0 refills | Status: AC | PRN

## 2018-11-16 NOTE — Progress Notes (Signed)
He presents today for follow-up of his surgery date of surgery 10/21/2018 status post Lorenz CoasterKeller bunionectomy right foot and fifth metatarsal osteotomies with fifth hammertoe repairs bilaterally.  He states that his feet still hurt but are doing better all the time.  Objective: Vital signs are stable alert and oriented x3.  Pulses are palpable.  Neurologic sensorium is intact degenerative flexors are intact incision sites going on to heal very nicely minimal edema.  Great range of motion of the first metatarsophalangeal joint with some tenderness.  Fifth metatarsal osteotomy sites appear to go on to heal uneventfully radiographs confirming today fifth metatarsal osteotomies are healing but not yet healed lately.  Still considerable edema in the right foot.  Implant is in good position.  Assessment: Well-healing surgical foot non-complicated bilateral.  Plan: Encouraged contrast baths range of motion exercises and follow me up with 2 weeks at which time we will try to get into her regular tennis shoes.

## 2018-11-30 ENCOUNTER — Ambulatory Visit (INDEPENDENT_AMBULATORY_CARE_PROVIDER_SITE_OTHER): Payer: BLUE CROSS/BLUE SHIELD

## 2018-11-30 ENCOUNTER — Ambulatory Visit (INDEPENDENT_AMBULATORY_CARE_PROVIDER_SITE_OTHER): Payer: BLUE CROSS/BLUE SHIELD | Admitting: Podiatry

## 2018-11-30 ENCOUNTER — Encounter: Payer: Self-pay | Admitting: Podiatry

## 2018-11-30 DIAGNOSIS — M2042 Other hammer toe(s) (acquired), left foot: Secondary | ICD-10-CM

## 2018-11-30 DIAGNOSIS — M21622 Bunionette of left foot: Secondary | ICD-10-CM

## 2018-11-30 DIAGNOSIS — M2041 Other hammer toe(s) (acquired), right foot: Secondary | ICD-10-CM

## 2018-11-30 DIAGNOSIS — Z9889 Other specified postprocedural states: Secondary | ICD-10-CM

## 2018-11-30 DIAGNOSIS — M205X1 Other deformities of toe(s) (acquired), right foot: Secondary | ICD-10-CM

## 2018-11-30 DIAGNOSIS — M21621 Bunionette of right foot: Secondary | ICD-10-CM

## 2018-12-01 NOTE — Progress Notes (Signed)
He presents today for follow-up of his fifth metatarsal osteotomies bilateral and Keller bunionectomy with implant right foot.  Date of surgery 10/21/2018.  Denies fever chills nausea vomiting muscle aches and pains.  Objective: Vital signs are stable alert oriented x3.  There is no erythema edema cellulitis drainage or odor.  He has good range of motion of the first metatarsophalangeal joint with a reduction in his edema.  His fifth metatarsal osteotomy sites are going on to heal quite well as well  Radiographs taken today demonstrate a well-healing Keller arthroplasty with single silicone implant as well as fifth metatarsal osteotomies.  Assessment: Well-healing surgical foot bilateral.  Plan: Continue currently the use of Darco shoes possibly trying to move into regular shoes over the next 2 weeks.  Follow-up with him in a few weeks.

## 2018-12-05 ENCOUNTER — Encounter: Payer: Self-pay | Admitting: Podiatry

## 2018-12-28 ENCOUNTER — Encounter: Payer: Self-pay | Admitting: Podiatry

## 2018-12-28 ENCOUNTER — Ambulatory Visit (INDEPENDENT_AMBULATORY_CARE_PROVIDER_SITE_OTHER): Payer: Self-pay | Admitting: Podiatry

## 2018-12-28 ENCOUNTER — Ambulatory Visit (INDEPENDENT_AMBULATORY_CARE_PROVIDER_SITE_OTHER): Payer: Managed Care, Other (non HMO)

## 2018-12-28 DIAGNOSIS — M205X1 Other deformities of toe(s) (acquired), right foot: Secondary | ICD-10-CM | POA: Diagnosis not present

## 2018-12-28 DIAGNOSIS — M2041 Other hammer toe(s) (acquired), right foot: Secondary | ICD-10-CM

## 2018-12-28 DIAGNOSIS — M2042 Other hammer toe(s) (acquired), left foot: Secondary | ICD-10-CM

## 2018-12-28 DIAGNOSIS — M21622 Bunionette of left foot: Secondary | ICD-10-CM | POA: Diagnosis not present

## 2018-12-28 DIAGNOSIS — M21621 Bunionette of right foot: Secondary | ICD-10-CM | POA: Diagnosis not present

## 2018-12-28 DIAGNOSIS — Z9889 Other specified postprocedural states: Secondary | ICD-10-CM

## 2018-12-28 MED ORDER — MELOXICAM 15 MG PO TABS: 15.0000 mg | ORAL_TABLET | Freq: Every day | ORAL | 3 refills | Status: DC

## 2018-12-28 NOTE — Progress Notes (Signed)
Aaron Watson presents today for a follow-up of his Keller bunionectomy with implant right foot fifth metatarsal osteotomy fifth bilateral.  Date of surgery October 21, 2018.  States that it feels like him walking on small ball and it still swells a lot.  He states that there is some soreness in the fifth met as well.  He states that he would like to go back to work.  Objective: Vital signs are stable he is alert and oriented x3 he has great range of motion of the first metatarsophalangeal joint right and mild tenderness on palpation of the fifth metatarsals bilaterally.  He does have significant tenderness on palpation sub-tibial sesamoid but appears to feels as though the sesamoid is moving in doing exactly what it should.  Radiographs today demonstrate a decrease in the edema bilaterally.  Assessment: Well-healing surgical foot  Plan: Discussed etiology pathology and surgical therapies at this point we can allow him to get back to work January 23, 2019 without restrictions.  On follow-up with him in approximately 1 month   

## 2018-12-29 ENCOUNTER — Other Ambulatory Visit: Payer: Self-pay

## 2018-12-29 ENCOUNTER — Encounter: Payer: Self-pay | Admitting: Podiatry

## 2018-12-29 ENCOUNTER — Other Ambulatory Visit: Payer: Self-pay | Admitting: Physician Assistant

## 2018-12-29 DIAGNOSIS — Z01810 Encounter for preprocedural cardiovascular examination: Secondary | ICD-10-CM

## 2018-12-29 DIAGNOSIS — I4819 Other persistent atrial fibrillation: Secondary | ICD-10-CM

## 2018-12-29 MED ORDER — FLECAINIDE ACETATE 150 MG PO TABS: 150.0000 mg | ORAL_TABLET | Freq: Two times a day (BID) | ORAL | 0 refills | Status: DC

## 2018-12-29 NOTE — Telephone Encounter (Signed)
*  STAT* If patient is at the pharmacy, call can be transferred to refill team.   1. Which medications need to be refilled? (please list name of each medication and dose if known) Flecainide   2. Which pharmacy/location (including street and city if local pharmacy) is medication to be sent to? [facility] [place]   3. Do they need a 30 day or 90 day supply? 90

## 2018-12-29 NOTE — Telephone Encounter (Signed)
flecainide (TAMBOCOR) 150 MG tablet 180 tablet 0 12/29/2018    Sig - Route: Take 1 tablet (150 mg total) by mouth 2 (two) times daily. - Oral   Sent to pharmacy as: flecainide (TAMBOCOR) 150 MG tablet   E-Prescribing Status: Receipt confirmed by pharmacy (12/29/2018 10:56 AM EST)   Pharmacy   [id number] - [place], [place] - [address] AT [facility] OF [address] [facility]

## 2018-12-30 ENCOUNTER — Other Ambulatory Visit: Payer: Self-pay

## 2018-12-30 ENCOUNTER — Ambulatory Visit (INDEPENDENT_AMBULATORY_CARE_PROVIDER_SITE_OTHER): Payer: Managed Care, Other (non HMO)

## 2018-12-30 DIAGNOSIS — Z01810 Encounter for preprocedural cardiovascular examination: Secondary | ICD-10-CM

## 2018-12-30 DIAGNOSIS — I4819 Other persistent atrial fibrillation: Secondary | ICD-10-CM

## 2019-01-02 ENCOUNTER — Telehealth: Payer: Self-pay

## 2019-01-02 NOTE — Telephone Encounter (Signed)
Called pt to discuss echo results. Pt verbalized understanding. No new orders at this time.  Advised pt to call for any further questions or concerns.

## 2019-01-02 NOTE — Telephone Encounter (Signed)
-----   Message from Sondra Barges, PA-C sent at 01/01/2019  2:28 PM EST ----- Echo showed normal pump function, normal wall motion, normal relaxing of the heart, mildly leaky mitral valve.  Reassuring study.  The mildly leaky mitral valve can be followed up with periodic echoes.

## 2019-01-12 ENCOUNTER — Telehealth: Payer: Self-pay | Admitting: *Deleted

## 2019-01-12 NOTE — Telephone Encounter (Signed)
I called pt and informed that he could have swelling and pain to varying degrees for 6-9 months postop, and that he should continue the meloxicam, wear stiff or thick soled shoes to decrease the overuse to the area that could be irritating, to continue the compression sock being certain to put on 1st thing in the morning before swinging legs over the side of the bed, to assist in training the foot not to swell, because at this time his foot is dealing with day to day swelling and postop swelling. I offered pt an earlier appt and also told him I would contact Dr. Al Corpus for more information.

## 2019-01-12 NOTE — Telephone Encounter (Signed)
He needs to make sure he has decreased his activity level and is not doing too much.  He had a Keller arthroplasty with an implant and fifth metatarsal osteotomies bilaterally.  I would recommend that he discontinue the meloxicam and start on a Medrol Dosepak which you can call in and once that is completed then start back on the meloxicam.

## 2019-01-12 NOTE — Telephone Encounter (Signed)
Pt states he has surgery in November 2019 and is still having swelling, and some pain, taking meloxicam and last couple of days performed ice soaks. Pt request what else to do.

## 2019-01-13 MED ORDER — METHYLPREDNISOLONE 4 MG PO TBPK: ORAL_TABLET | ORAL | 0 refills | Status: DC

## 2019-01-13 NOTE — Telephone Encounter (Signed)
I informed pt of Dr. Geryl Rankins orders. Pt states understanding.

## 2019-01-30 ENCOUNTER — Other Ambulatory Visit: Payer: Self-pay

## 2019-01-30 MED ORDER — FLECAINIDE ACETATE 150 MG PO TABS: 150.0000 mg | ORAL_TABLET | Freq: Two times a day (BID) | ORAL | 3 refills | Status: DC

## 2019-01-30 NOTE — Telephone Encounter (Signed)
*  STAT* If patient is at the pharmacy, call can be transferred to refill team.   1. Which medications need to be refilled? (please list name of each medication and dose if known) Flecainide  2. Which pharmacy/location (including street and city if local pharmacy) is medication to be sent to? [facility]  3. Do they need a 30 day or 90 day supply? 90

## 2019-02-01 ENCOUNTER — Encounter: Payer: Managed Care, Other (non HMO) | Admitting: Podiatry

## 2019-02-06 ENCOUNTER — Encounter: Payer: Self-pay | Admitting: Family Medicine

## 2019-03-15 ENCOUNTER — Ambulatory Visit (INDEPENDENT_AMBULATORY_CARE_PROVIDER_SITE_OTHER): Payer: Managed Care, Other (non HMO)

## 2019-03-15 ENCOUNTER — Encounter: Payer: Self-pay | Admitting: Podiatry

## 2019-03-15 ENCOUNTER — Other Ambulatory Visit: Payer: Self-pay

## 2019-03-15 ENCOUNTER — Ambulatory Visit (INDEPENDENT_AMBULATORY_CARE_PROVIDER_SITE_OTHER): Payer: Managed Care, Other (non HMO) | Admitting: Podiatry

## 2019-03-15 ENCOUNTER — Telehealth: Payer: Self-pay | Admitting: *Deleted

## 2019-03-15 ENCOUNTER — Encounter: Payer: Managed Care, Other (non HMO) | Admitting: Podiatry

## 2019-03-15 DIAGNOSIS — M2041 Other hammer toe(s) (acquired), right foot: Secondary | ICD-10-CM | POA: Diagnosis not present

## 2019-03-15 DIAGNOSIS — M21621 Bunionette of right foot: Secondary | ICD-10-CM | POA: Diagnosis not present

## 2019-03-15 DIAGNOSIS — M2042 Other hammer toe(s) (acquired), left foot: Secondary | ICD-10-CM | POA: Diagnosis not present

## 2019-03-15 DIAGNOSIS — M21622 Bunionette of left foot: Secondary | ICD-10-CM

## 2019-03-15 DIAGNOSIS — M205X1 Other deformities of toe(s) (acquired), right foot: Secondary | ICD-10-CM | POA: Diagnosis not present

## 2019-03-15 MED ORDER — PREDNISONE 5 MG PO TBEC: DELAYED_RELEASE_TABLET | ORAL | 0 refills | Status: DC

## 2019-03-15 NOTE — Telephone Encounter (Signed)
Faxed required form, demographics to [facility] for Rayos.

## 2019-03-15 NOTE — Progress Notes (Signed)
Subjective:  Patient ID: Aaron Watson, male    DOB: 1961-06-24,  MRN: [id number]  Chief Complaint  Patient presents with  . Foot Pain    Patient presents today with continued pain in top and bottom of right foot since surgery in November 2019,  He reports its a sharp pain when walking and throbs after being on foot all day.  He reports Meloxicam, ice and soaking in Epson salt is not helping    58 y.o. male presents with the above complaint.  History above confirmed with patient.   Review of Systems: Negative except as noted in the HPI. Denies N/V/F/Ch.  Past Medical History:  Diagnosis Date  . Anxiety   . Arthritis    osteoarthritis  . Depression   . GERD (gastroesophageal reflux disease)   . Itching   . OSA on CPAP   . Persistent atrial fibrillation    a. CHADS2ASc 0; b. flecainide   . PONV (postoperative nausea and vomiting)   . Restless leg syndrome     Current Outpatient Medications:  .  acetaminophen (TYLENOL) 500 MG tablet, Take 1,000 mg by mouth every 6 (six) hours as needed (for pain.)., Disp: , Rfl:  .  Ascorbic Acid (C 500/ROSE HIPS PO), Take 500 mg by mouth daily., Disp: , Rfl:  .  B Complex-C (SUPER B COMPLEX PO), Take 1 capsule by mouth daily. , Disp: , Rfl:  .  flecainide (TAMBOCOR) 150 MG tablet, Take 1 tablet (150 mg total) by mouth 2 (two) times daily., Disp: 180 tablet, Rfl: 3 .  ibuprofen (ADVIL,MOTRIN) 200 MG tablet, Take 400 mg by mouth every 8 (eight) hours as needed (for pain.)., Disp: , Rfl:  .  meloxicam (MOBIC) 15 MG tablet, Take 1 tablet (15 mg total) by mouth daily., Disp: 30 tablet, Rfl: 3 .  methylPREDNISolone (MEDROL) 4 MG TBPK tablet, Take as directed., Disp: 21 tablet, Rfl: 0 .  ondansetron (ZOFRAN) 4 MG tablet, Take 1 tablet (4 mg total) by mouth every 8 (eight) hours as needed for nausea or vomiting., Disp: 20 tablet, Rfl: 0 .  pramipexole (MIRAPEX) 0.125 MG tablet, Take 0.5 mg by mouth at bedtime. Take 4 tablets 2 hours before bedtime.  , Disp: , Rfl:  .  Probiotic Product (PROBIOTIC PO), Take 1 capsule by mouth daily. , Disp: , Rfl:  .  sertraline (ZOLOFT) 100 MG tablet, TAKE 1 TABLET(100 MG) BY MOUTH DAILY, Disp: 90 tablet, Rfl: 2 .  tadalafil (CIALIS) 5 MG tablet, Take 1 tablet (5 mg total) by mouth daily as needed for erectile dysfunction., Disp: 90 tablet, Rfl: 4 .  valACYclovir (VALTREX) 500 MG tablet, Take 1 tablet (500 mg total) by mouth daily., Disp: 90 tablet, Rfl: 4  Social History   Tobacco Use  Smoking Status Former Smoker  . Packs/day: 0.50  . Years: 10.00  . Pack years: 5.00  . Types: Cigarettes  Smokeless Tobacco Never Used  Tobacco Comment   Quit 10+ years ago.    Allergies  Allergen Reactions  . Other Other (See Comments)    STEROIDS CAUSES HICCUPS AND DISTURBS DIGESTIVE SYSTEM  . Prednisone Other (See Comments)    HICCUPS AND "DISTURBS DIGESTIVE SYSTEM" (nausea/bloating)   Objective:  There were no vitals filed for this visit. There is no height or weight on file to calculate BMI. Constitutional Well developed. Well nourished.  Vascular Dorsalis pedis pulses palpable bilaterally. Posterior tibial pulses palpable bilaterally. Capillary refill normal to all digits.  No cyanosis  or clubbing noted. Pedal hair growth normal.  Neurologic Normal speech. Oriented to person, place, and time. Epicritic sensation to light touch grossly present bilaterally.  Dermatologic Nails well groomed and normal in appearance. No open wounds. No skin lesions.  Orthopedic:  Pain to palpation about the fifth metatarsal head about the area of the screw is able to be palpated.  Pain about the sesamoid complex, pain along the medial longitudinal arch and at the second third MPJs.   Radiographs: taken and reviewed. S/p 1st MPJ replacement, 5th met osteotomy.  No acute fractures or dislocations. Assessment:   1. Hallux limitus of right foot   2. Hammertoe, bilateral   3. Tailor's bunionette, bilateral    Plan:   Patient was evaluated and treated and all questions answered.  Right foot pain, s/p right talus bunionectomy, first MPJ replacement -X-rays reviewed with patient. -Discussed possible removal of the fifth metatarsal screw if he continues to prove problematic -Overall I think his foot is healing well I do think that there are some first distribution problem but he would likely benefit from custom orthotics.  We will look into benefits to see if they are covered for patient. -Recommended oral steroids to help calm down pain inflammation and swelling.  Patient has had side effects with prednisone before we will consider Rayos tablets for controlled release  Return in about 4 weeks (around 04/12/2019) for right foot pain f/u .

## 2019-03-15 NOTE — Telephone Encounter (Signed)
Discussed with patient I think the long acting will be better tolerated, but he can stop if same symptoms develop.

## 2019-03-15 NOTE — Telephone Encounter (Signed)
Dr. Samuella Cota ordered Rayos 5mg  #21 take as a 6 day tapering dose. prepared required form, and demographics to fax to [facility], once Dr. Samuella Cota advises concerning prednisone untowards effects pt has complained of previously.

## 2019-05-12 ENCOUNTER — Other Ambulatory Visit: Payer: Self-pay | Admitting: Family Medicine

## 2019-06-08 ENCOUNTER — Encounter: Payer: BLUE CROSS/BLUE SHIELD | Admitting: Family Medicine

## 2019-06-09 ENCOUNTER — Encounter: Payer: Self-pay | Admitting: Family Medicine

## 2019-06-20 ENCOUNTER — Encounter: Payer: Self-pay | Admitting: Podiatry

## 2019-06-22 ENCOUNTER — Other Ambulatory Visit: Payer: Self-pay | Admitting: Family Medicine

## 2019-06-22 ENCOUNTER — Ambulatory Visit
Admission: RE | Admit: 2019-06-22 | Discharge: 2019-06-22 | Disposition: A | Discharge status: Home / Self Care | Payer: Managed Care, Other (non HMO) | Source: Ambulatory Visit | Attending: Family Medicine | Admitting: Family Medicine

## 2019-06-22 ENCOUNTER — Other Ambulatory Visit: Payer: Self-pay

## 2019-06-22 DIAGNOSIS — R109 Unspecified abdominal pain: Secondary | ICD-10-CM | POA: Diagnosis present

## 2019-06-22 DIAGNOSIS — R319 Hematuria, unspecified: Secondary | ICD-10-CM | POA: Diagnosis present

## 2019-06-22 DIAGNOSIS — N200 Calculus of kidney: Secondary | ICD-10-CM | POA: Diagnosis not present

## 2019-07-13 ENCOUNTER — Encounter: Payer: Self-pay | Admitting: Podiatry

## 2019-07-13 ENCOUNTER — Other Ambulatory Visit: Payer: Self-pay

## 2019-07-13 ENCOUNTER — Ambulatory Visit (INDEPENDENT_AMBULATORY_CARE_PROVIDER_SITE_OTHER): Payer: Managed Care, Other (non HMO) | Admitting: Podiatry

## 2019-07-13 VITALS — Temp 97.5°F

## 2019-07-13 DIAGNOSIS — M216X2 Other acquired deformities of left foot: Secondary | ICD-10-CM

## 2019-07-13 DIAGNOSIS — M216X1 Other acquired deformities of right foot: Secondary | ICD-10-CM

## 2019-07-13 DIAGNOSIS — M7751 Other enthesopathy of right foot: Secondary | ICD-10-CM

## 2019-07-13 MED ORDER — CELECOXIB 200 MG PO CAPS: 200.0000 mg | ORAL_CAPSULE | Freq: Two times a day (BID) | ORAL | 2 refills | Status: DC

## 2019-07-13 NOTE — Progress Notes (Signed)
He presents today chief complaint of painful second metatarsal phalangeal joint.  Objective: Vital signs are stable alert oriented x3.  He has pain on palpation second metatarsal phalangeal joint of the right foot.  Assessment: Capsulitis second metatarsal phalangeal joint.  Plan: Discussed etiology pathology conservative versus surgical therapies at this point after Betadine skin prep distraction of the second metatarsal phalangeal joint introduced to milligrams of dexamethasone and local anesthetic directly into the joint.  Alleviating the symptoms.  Change him to Celebrex rather than meloxicam.  Also discussed orthotics which he does not want to have to purchase he would like him for his insurance to cover so we were going to check for insurance coverage on that.

## 2019-07-14 ENCOUNTER — Telehealth: Payer: Self-pay | Admitting: Podiatry

## 2019-07-14 NOTE — Telephone Encounter (Signed)
Pt left message asking if I had gotten benefit information for the orthotics.  I returned call and they  Orthotics [phone]) is  covered @ 80% after deductible (pt has met) but it is dx driven and I would talk to Dr Milinda Pointer about it and get back to pt next Tuesday or Wednesday.

## 2019-07-18 NOTE — Telephone Encounter (Signed)
Pt aware of coverage and I have discussed with Dr Milinda Pointer and pt is wanting to proceed with orthotics. I will call pt when they come in... dx code is M21.6x1

## 2019-07-21 ENCOUNTER — Encounter: Payer: Self-pay | Admitting: Family Medicine

## 2019-07-28 ENCOUNTER — Encounter: Payer: Self-pay | Admitting: Family Medicine

## 2019-08-08 ENCOUNTER — Encounter: Payer: Self-pay | Admitting: Podiatry

## 2019-08-08 ENCOUNTER — Ambulatory Visit (INDEPENDENT_AMBULATORY_CARE_PROVIDER_SITE_OTHER): Payer: Managed Care, Other (non HMO) | Admitting: Podiatry

## 2019-08-08 ENCOUNTER — Other Ambulatory Visit: Payer: Self-pay

## 2019-08-08 ENCOUNTER — Encounter: Payer: Managed Care, Other (non HMO) | Admitting: Orthotics

## 2019-08-08 VITALS — Temp 97.7°F

## 2019-08-08 DIAGNOSIS — M79674 Pain in right toe(s): Secondary | ICD-10-CM

## 2019-08-08 DIAGNOSIS — L84 Corns and callosities: Secondary | ICD-10-CM

## 2019-08-08 NOTE — Patient Instructions (Signed)

## 2019-08-11 ENCOUNTER — Encounter: Payer: Self-pay | Admitting: Family Medicine

## 2019-08-17 NOTE — Progress Notes (Addendum)
Subjective:  Aaron Watson presents to clinic today with cc of  Painful 4th digit callus right foot. Patient states area is sore on the bottom of the digit. He has tried nothing to treat it. He had surgery on right foot several months ago. He picked up orthotics which were ordered by Dr. Milinda Pointer.  Current Outpatient Medications:  .  acetaminophen (TYLENOL) 500 MG tablet, Take 1,000 mg by mouth every 6 (six) hours as needed (for pain.)., Disp: , Rfl:  .  Ascorbic Acid (C 500/ROSE HIPS PO), Take 500 mg by mouth daily., Disp: , Rfl:  .  B Complex-C (SUPER B COMPLEX PO), Take 1 capsule by mouth daily. , Disp: , Rfl:  .  celecoxib (CELEBREX) 200 MG capsule, Take 1 capsule (200 mg total) by mouth 2 (two) times daily., Disp: 60 capsule, Rfl: 2 .  flecainide (TAMBOCOR) 150 MG tablet, Take 1 tablet (150 mg total) by mouth 2 (two) times daily., Disp: 180 tablet, Rfl: 3 .  ibuprofen (ADVIL,MOTRIN) 200 MG tablet, Take 400 mg by mouth every 8 (eight) hours as needed (for pain.)., Disp: , Rfl:  .  meloxicam (MOBIC) 15 MG tablet, Take 1 tablet (15 mg total) by mouth daily., Disp: 30 tablet, Rfl: 3 .  methylPREDNISolone (MEDROL) 4 MG TBPK tablet, Take as directed., Disp: 21 tablet, Rfl: 0 .  ondansetron (ZOFRAN) 4 MG tablet, Take 1 tablet (4 mg total) by mouth every 8 (eight) hours as needed for nausea or vomiting., Disp: 20 tablet, Rfl: 0 .  pramipexole (MIRAPEX) 0.125 MG tablet, Take 0.5 mg by mouth at bedtime. Take 4 tablets 2 hours before bedtime. , Disp: , Rfl:  .  predniSONE (RAYOS) 5 MG TBEC, Take as 6 day tapering dose., Disp: 21 tablet, Rfl: 0 .  Probiotic Product (PROBIOTIC PO), Take 1 capsule by mouth daily. , Disp: , Rfl:  .  sertraline (ZOLOFT) 100 MG tablet, TAKE 1 TABLET(100 MG) BY MOUTH DAILY, Disp: 90 tablet, Rfl: 2 .  tadalafil (CIALIS) 5 MG tablet, Take 1 tablet (5 mg total) by mouth daily as needed for erectile dysfunction., Disp: 90 tablet, Rfl: 4 .  valACYclovir (VALTREX) 500 MG tablet,  TAKE 1 TABLET(500 MG) BY MOUTH DAILY, Disp: 90 tablet, Rfl: 0   Allergies  Allergen Reactions  . Other Other (See Comments)    STEROIDS CAUSES HICCUPS AND DISTURBS DIGESTIVE SYSTEM  . Prednisone Other (See Comments)    HICCUPS AND "DISTURBS DIGESTIVE SYSTEM" (nausea/bloating)     Objective: Vitals:   08/08/19 1621  Temp: 97.7 F (36.5 C)    Physical Examination:  Vascular Examination: Capillary refill time immediate x 10 digits.  Palpable DP/PT pulses b/l.  Digital hair present b/l.  No edema noted b/l.  Skin temperature gradient WNL b/l.  Dermatological Examination: Skin with normal turgor, texture and tone b/l.  Well healed surgical scar dorsal 1st MPJ.   No open wounds b/l.  No interdigital macerations noted b/l.  Hyperkeratotic lesion plantar 4th digit "spine callus" with tenderness to palpation. No erythema, no edema, no flocculence.   Musculoskeletal Examination: Muscle strength 5/5 to all muscle groups b/l.  No pain, crepitus or joint discomfort with active/passive ROM.  Neurological Examination: Sensation intact 5/5 b/l with 10 gram monofilament.  Vibratory sensation intact b/l.  Proprioceptive sensation intact b/l.  Assessment: Spine callus right 4th digit Pain in toe  Plan: Hyperkeratotic lesion pared right 4th toe utilizing sterile scalpel blade without incident. Patient noted relief post-treatment on today.  2.  Continue soft, supportive shoe gear daily. 3.  Report any pedal injuries to medical professional. 4.  Follow up when symptoms return. 5.  Patient/POA to call should there be a question/concern in there interim.

## 2019-08-22 ENCOUNTER — Telehealth: Payer: Self-pay | Admitting: *Deleted

## 2019-08-22 NOTE — Telephone Encounter (Signed)
Pt states he lost the silicone shield for his right 4th toe he was given and would like to get another.

## 2019-08-22 NOTE — Telephone Encounter (Signed)
I informed pt that he could pick up another toe shield in [place] today, but would have to wait until tomorrow to pick up in [place]. Pt states understanding.

## 2019-10-04 ENCOUNTER — Encounter: Payer: Self-pay | Admitting: Family Medicine

## 2019-10-31 ENCOUNTER — Telehealth: Payer: Self-pay | Admitting: Cardiovascular Disease

## 2019-10-31 NOTE — Telephone Encounter (Signed)
Virtual Visit Pre-Appointment Phone Call  "(Name), I am calling you today to discuss your upcoming appointment. We are currently trying to limit exposure to the virus that causes COVID-19 by seeing patients at home rather than in the office."  1. "What is the BEST phone number to call the day of the visit?" - include this in appointment notes  2. Do you have or have access to (through a family member/friend) a smartphone with video capability that we can use for your visit?" a. If yes - list this number in appt notes as cell (if different from BEST phone #) and list the appointment type as a VIDEO visit in appointment notes b. If no - list the appointment type as a PHONE visit in appointment notes  3. Confirm consent - "In the setting of the current Covid19 crisis, you are scheduled for a (phone or video) visit with your provider on (date) at (time).  Just as we do with many in-office visits, in order for you to participate in this visit, we must obtain consent.  If you'd like, I can send this to your mychart (if signed up) or email for you to review.  Otherwise, I can obtain your verbal consent now.  All virtual visits are billed to your insurance company just like a normal visit would be.  By agreeing to a virtual visit, we'd like you to understand that the technology does not allow for your provider to perform an examination, and thus may limit your provider's ability to fully assess your condition. If your provider identifies any concerns that need to be evaluated in person, we will make arrangements to do so.  Finally, though the technology is pretty good, we cannot assure that it will always work on either your or our end, and in the setting of a video visit, we may have to convert it to a phone-only visit.  In either situation, we cannot ensure that we have a secure connection.  Are you willing to proceed?" STAFF: Did the patient verbally acknowledge consent to telehealth visit? Document  YES/NO here: YES  4. Advise patient to be prepared - "Two hours prior to your appointment, go ahead and check your blood pressure, pulse, oxygen saturation, and your weight (if you have the equipment to check those) and write them all down. When your visit starts, your provider will ask you for this information. If you have an Apple Watch or Kardia device, please plan to have heart rate information ready on the day of your appointment. Please have a pen and paper handy nearby the day of the visit as well."  5. Give patient instructions for MyChart download to smartphone OR Doximity/Doxy.me as below if video visit (depending on what platform provider is using)  6. Inform patient they will receive a phone call 15 minutes prior to their appointment time (may be from unknown caller ID) so they should be prepared to answer    [name] has been deemed a candidate for a follow-up tele-health visit to limit community exposure during the Covid-19 pandemic. I spoke with the patient via phone to ensure availability of phone/video source, confirm preferred email & phone number, and discuss instructions and expectations.  I reminded Aaron Watson to be prepared with any vital sign and/or heart rhythm information that could potentially be obtained via home monitoring, at the time of his visit. I reminded Aaron Watson to expect a phone  call prior to his visit.  Aaron Watson 10/31/2019 4:00 PM   INSTRUCTIONS FOR DOWNLOADING THE MYCHART APP TO SMARTPHONE  - The patient must first make sure to have activated MyChart and know their login information - If Apple, go to [employer] and type in MyChart in the search bar and download the app. If Android, ask patient to go to [employer] and type in [place] in the search bar and download the app. The app is free but as with any other app downloads, their phone may require them to verify saved payment information or  Apple/Android password.  - The patient will need to then log into the app with their MyChart username and password, and select [facility] as their healthcare provider to link the account. When it is time for your visit, go to the MyChart app, find appointments, and click Begin Video Visit. Be sure to Select Allow for your device to access the Microphone and Camera for your visit. You will then be connected, and your provider will be with you shortly.  **If they have any issues connecting, or need assistance please contact MyChart service desk [phone])**  **If using a computer, in order to ensure the best quality for their visit they will need to use either of the following Internet Browsers: [employer] or Google Chrome**  IF USING DOXIMITY or DOXY.ME - The patient will receive a link just prior to their visit by text.     FULL LENGTH CONSENT FOR TELE-HEALTH VISIT   I hereby voluntarily request, consent and authorize [facility] and its employed or contracted physicians, physician assistants, nurse practitioners or other licensed health care professionals (the Practitioner), to provide me with telemedicine health care services (the Services") as deemed necessary by the treating Practitioner. I acknowledge and consent to receive the Services by the Practitioner via telemedicine. I understand that the telemedicine visit will involve communicating with the Practitioner through live audiovisual communication technology and the disclosure of certain medical information by electronic transmission. I acknowledge that I have been given the opportunity to request an in-person assessment or other available alternative prior to the telemedicine visit and am voluntarily participating in the telemedicine visit.  I understand that I have the right to withhold or withdraw my consent to the use of telemedicine in the course of my care at any time, without affecting my right to future care  or treatment, and that the Practitioner or I may terminate the telemedicine visit at any time. I understand that I have the right to inspect all information obtained and/or recorded in the course of the telemedicine visit and may receive copies of available information for a reasonable fee.  I understand that some of the potential risks of receiving the Services via telemedicine include:   Delay or interruption in medical evaluation due to technological equipment failure or disruption;  Information transmitted may not be sufficient (e.g. poor resolution of images) to allow for appropriate medical decision making by the Practitioner; and/or   In rare instances, security protocols could fail, causing a breach of personal health information.  Furthermore, I acknowledge that it is my responsibility to provide information about my medical history, conditions and care that is complete and accurate to the best of my ability. I acknowledge that Practitioner's advice, recommendations, and/or decision may be based on factors not within their control, such as incomplete or inaccurate data provided by me or distortions of diagnostic images or specimens that may result from electronic  transmissions. I understand that the practice of medicine is not an exact science and that Practitioner makes no warranties or guarantees regarding treatment outcomes. I acknowledge that I will receive a copy of this consent concurrently upon execution via email to the email address I last provided but may also request a printed copy by calling the office of [facility].    I understand that my insurance will be billed for this visit.   I have read or had this consent read to me.  I understand the contents of this consent, which adequately explains the benefits and risks of the Services being provided via telemedicine.   I have been provided ample opportunity to ask questions regarding this consent and the Services and have had  my questions answered to my satisfaction.  I give my informed consent for the services to be provided through the use of telemedicine in my medical care  By participating in this telemedicine visit I agree to the above.

## 2019-11-02 ENCOUNTER — Other Ambulatory Visit: Payer: Self-pay

## 2019-11-02 ENCOUNTER — Encounter: Payer: Self-pay | Admitting: Cardiovascular Disease

## 2019-11-02 ENCOUNTER — Telehealth (INDEPENDENT_AMBULATORY_CARE_PROVIDER_SITE_OTHER): Payer: Managed Care, Other (non HMO) | Admitting: Cardiovascular Disease

## 2019-11-02 VITALS — BP 140/89 | HR 72 | Ht 71.0 in | Wt 202.0 lb

## 2019-11-02 DIAGNOSIS — I480 Paroxysmal atrial fibrillation: Secondary | ICD-10-CM | POA: Diagnosis not present

## 2019-11-02 DIAGNOSIS — I2510 Atherosclerotic heart disease of native coronary artery without angina pectoris: Secondary | ICD-10-CM

## 2019-11-02 DIAGNOSIS — E782 Mixed hyperlipidemia: Secondary | ICD-10-CM

## 2019-11-02 MED ORDER — FLECAINIDE ACETATE 150 MG PO TABS: 150.0000 mg | ORAL_TABLET | Freq: Two times a day (BID) | ORAL | 3 refills | Status: DC

## 2019-11-02 NOTE — Progress Notes (Signed)
Virtual Visit via Video Note   This visit type was conducted due to national recommendations for restrictions regarding the COVID-19 Pandemic (e.g. social distancing) in an effort to limit this patient's exposure and mitigate transmission in our community.  Due to his co-morbid illnesses, this patient is at least at moderate risk for complications without adequate follow up.  This format is felt to be most appropriate for this patient at this time.  All issues noted in this document were discussed and addressed.  A limited physical exam was performed with this format.  Please refer to the patient's chart for his consent to telehealth for [facility].   Date:  11/02/2019   ID:  Aaron Watson, DOB 08-Jul-1961, MRN [id number]  Patient Location: Home Provider Location: Office  PCP:  Steele Sizer, MD  Cardiologist:  No primary care provider on file.  Electrophysiologist:  None   Evaluation Performed:  Follow-Up Visit  Chief Complaint: No complaints  History of Present Illness:    Aaron Watson is a 58 y.o. male who was reached via video call today for a follow-up visit regarding  atrial fibrillation maintained in sinus rhythm with flecainide.  He was diagnosed with  atrial fibrillation in 2008. He has been maintaining in sinus rhythm on flecainide since then. He had cardiac catheterization done in 2008 which showed no evidence of obstructive coronary artery disease. There was a 40% stenosis in the mid LAD.  He had a treadmill nuclear stress test in 2013, and which showed no evidence of ischemia with normal ejection fraction. He does have known history of sleep apnea on CPAP.   Most recent echocardiogram in January 2020 showed normal LV systolic function with no significant valvular abnormalities. He does have known history of mixed hyperlipidemia and took rosuvastatin for few years but then discontinued as he wanted to focus on lifestyle changes.  At some point, he was able  to lose significant weight with regular exercise but he had foot surgery and since then he has not been able to do much exercise and gained weight again.  He denies chest pain, shortness of breath or palpitations.  The patient does not have symptoms concerning for COVID-19 infection (fever, chills, cough, or new shortness of breath).    Past Medical History:  Diagnosis Date  . Anxiety   . Arthritis    osteoarthritis  . Depression   . GERD (gastroesophageal reflux disease)   . Itching   . OSA on CPAP   . Persistent atrial fibrillation (HCC)    a. CHADS2ASc 0; b. flecainide   . PONV (postoperative nausea and vomiting)   . Restless leg syndrome    Past Surgical History:  Procedure Laterality Date  . CARDIAC CATHETERIZATION  2008   [facility]: 40% mid LAD lesion. Otherwise insignificant/minimal CAD.  Marland Kitchen CERVICAL DISC ARTHROPLASTY N/A 12/29/2017   Procedure: CERVICAL ANTERIOR DISC ARTHROPLASTY-C5-7;  Surgeon: Venetia Night, MD;  Location: [facility] ORS;  Service: Neurosurgery;  Laterality: N/A;  . NM MYOVIEW LTD  April 2008   Dr. Welton Flakes ([facility]) : Inferior wall defect, EF 55%. -- Suggested to the due to ischemia in RCA territory versus diaphragmatic attenuation normal EF.  Marland Kitchen NM MYOVIEW LTD  2013   Treadmill Myoview [facility]) no evidence of ischemia or infarction. Normal EF.  Marland Kitchen SHOULDER SURGERY Left   . TRANSTHORACIC ECHOCARDIOGRAM  July 20 13th; April 2016   At [facility]: a) normal LV function. Mild LA dilation, mild LVH and  mild TR/MR. b) Normal LV size and function. EF 55-60%. No RW MA. Trivial MR and TR. Otherwise normal     Current Meds  Medication Sig  . acetaminophen (TYLENOL) 500 MG tablet Take 1,000 mg by mouth every 6 (six) hours as needed (for pain.).  Marland Kitchen. Ascorbic Acid (C 500/ROSE HIPS PO) Take 500 mg by mouth daily.  . B Complex-C (SUPER B COMPLEX PO) Take 1 capsule by mouth daily.   . celecoxib (CELEBREX) 200 MG capsule Take 1 capsule (200 mg total) by mouth 2 (two) times  daily. (Patient taking differently: Take 200 mg by mouth 2 (two) times daily as needed. )  . flecainide (TAMBOCOR) 150 MG tablet Take 1 tablet (150 mg total) by mouth 2 (two) times daily.  Marland Kitchen. ibuprofen (ADVIL,MOTRIN) 200 MG tablet Take 400 mg by mouth every 8 (eight) hours as needed (for pain.).  Marland Kitchen. ondansetron (ZOFRAN) 4 MG tablet Take 1 tablet (4 mg total) by mouth every 8 (eight) hours as needed for nausea or vomiting.  . pramipexole (MIRAPEX) 0.125 MG tablet Take 0.5 mg by mouth at bedtime. Take 4 tablets 2 hours before bedtime.   . Probiotic Product (PROBIOTIC PO) Take 1 capsule by mouth daily.   . sertraline (ZOLOFT) 100 MG tablet TAKE 1 TABLET(100 MG) BY MOUTH DAILY  . tadalafil (CIALIS) 5 MG tablet Take 1 tablet (5 mg total) by mouth daily as needed for erectile dysfunction.  . valACYclovir (VALTREX) 500 MG tablet TAKE 1 TABLET(500 MG) BY MOUTH DAILY     Allergies:   Other and Prednisone   Social History   Tobacco Use  . Smoking status: Former Smoker    Packs/day: 0.50    Years: 10.00    Pack years: 5.00    Types: Cigarettes  . Smokeless tobacco: Never Used  . Tobacco comment: Quit 10+ years ago.  Substance Use Topics  . Alcohol use: Yes    Alcohol/week: 0.0 standard drinks    Comment: Occasional  . Drug use: No     Family Hx: The patient's family history includes Heart attack in his maternal grandfather; Hypertension in his father and mother; Neuropathy in his father.  ROS:   Please see the history of present illness.     All other systems reviewed and are negative.   Prior CV studies:   The following studies were reviewed today:  Reviewed results of most recent echocardiogram with him  Labs/Other Tests and Data Reviewed:    EKG:  No ECG reviewed.  Recent Labs: No results found for requested labs within last 8760 hours.   Recent Lipid Panel Lab Results  Component Value Date/Time   CHOL 193 05/16/2018 02:49 PM   CHOL CANCELED 11/02/2016 02:48 PM   TRIG  240 (H) 05/16/2018 02:49 PM   TRIG CANCELED 11/02/2016 02:48 PM   HDL 37 (L) 05/16/2018 02:49 PM   CHOLHDL 5.2 (H) 05/16/2018 02:49 PM   CHOLHDL 6.1 04/17/2015 07:08 AM   LDLCALC 108 (H) 05/16/2018 02:49 PM    Wt Readings from Last 3 Encounters:  11/02/19 202 lb (91.6 kg)  10/10/18 186 lb 8 oz (84.6 kg)  07/25/18 189 lb (85.7 kg)     Objective:    Vital Signs:  BP 140/89   Pulse 72   Ht 5\' 11"  (1.803 m)   Wt 202 lb (91.6 kg)   BMI 28.17 kg/m    VITAL SIGNS:  reviewed GEN:  no acute distress EYES:  sclerae anicteric, EOMI - Extraocular Movements  Intact RESPIRATORY:  normal respiratory effort, symmetric expansion SKIN:  no rash, lesions or ulcers. MUSCULOSKELETAL:  no obvious deformities. NEURO:  alert and oriented x 3, no obvious focal deficit PSYCH:  normal affect  ASSESSMENT & PLAN:    1.  Persistent atrial fibrillation: Currently maintaining in sinus rhythm with high-dose flecainide with no significant arrhythmia.CAHDs VASc score is 0.  If he develops atrial fibrillation on flecainide, will proceed with ablation evaluation.  2. Sleep apnea : He uses CPAP regularly  3. Coronary artery disease involving native coronary artery without angina: He had 40% LAD stenosis on previous cardiac catheterization with no anginal symptoms. Continue medical therapy.  4. Hyperlipidemia: I do think there is an indication for treatment with a statin given the presence of nonobstructive coronary artery disease on previous cardiac catheterization.  I did discuss with him but he wants to continue with lifestyle changes.  He is going to resume his exercise and will attempt weight loss.  He wants to wait few months before we recheck his lipid profile.  COVID-19 Education: The signs and symptoms of COVID-19 were discussed with the patient and how to seek care for testing (follow up with PCP or arrange E-visit).  The importance of social distancing was discussed today.  Time:   Today, I have  spent 10 minutes with the patient with telehealth technology discussing the above problems.     Medication Adjustments/Labs and Tests Ordered: Current medicines are reviewed at length with the patient today.  Concerns regarding medicines are outlined above.   Tests Ordered: No orders of the defined types were placed in this encounter.   Medication Changes: No orders of the defined types were placed in this encounter.   Follow Up:  In Person in 1 year(s)  Signed, Kathlyn Sacramento, MD  11/02/2019 7:52 AM    [place]

## 2019-11-02 NOTE — Patient Instructions (Signed)
Medication Instructions:  Your physician recommends that you continue on your current medications as directed. Please refer to the Current Medication list given to you today.  Flecainide has been refilled today   *If you need a refill on your cardiac medications before your next appointment, please call your pharmacy*  Lab Work: Your physician recommends that you return for a FASTING lipid profile in 4 months (March 2021)  Please have your fasting lab drawn at the [facility]. You do not need an appointment. Their hours are Mon-Fri 7am-6pm.  If you have labs (blood work) drawn today and your tests are completely normal, you will receive your results only by: Marland Kitchen MyChart Message (if you have MyChart) OR . A paper copy in the mail If you have any lab test that is abnormal or we need to change your treatment, we will call you to review the results.  Testing/Procedures: None ordered  Follow-Up: At [facility], you and your health needs are our priority.  As part of our continuing mission to provide you with exceptional heart care, we have created designated Provider Care Teams.  These Care Teams include your primary Cardiologist (physician) and Advanced Practice Providers (APPs -  Physician Assistants and Nurse Practitioners) who all work together to provide you with the care you need, when you need it.  Your next appointment:   12 month(s)  The format for your next appointment:   In Person  Provider:    You may see Dr. Fletcher Anon or one of the following Advanced Practice Providers on your designated Care Team:    Murray Hodgkins, NP  Christell Faith, PA-C  Marrianne Mood, PA-C   Other Instructions N/A

## 2019-12-11 ENCOUNTER — Telehealth: Payer: Self-pay | Admitting: Cardiovascular Disease

## 2019-12-11 NOTE — Telephone Encounter (Signed)
Spoke with the patient. Advised the patient that the "keto diet" is high in fat and can contribute to elevated cholesterol. Recommended that that the patient research the mediterranean/ low fat heart healthy diet.  Patient sts that he is suppose to start the keto diet with his wife and would like Dr. Tyrell Antonio thoughts. Advised the patient that Dr. Fletcher Anon is out of the office until next week. Advised the patient that I will fwd the question to Dr. Fletcher Anon and call back with his recommendation.  Patient voiced appreciation.

## 2019-12-11 NOTE — Telephone Encounter (Signed)
Patient calling in with questions about weight loss and a new diet(Keto). Please call to advise when able

## 2019-12-18 NOTE — Telephone Encounter (Signed)
The keto diet can be very effective in weight loss.  However, it can worsen cholesterol and we do not know the long-term cardiac implications of that versus the benefits achieved from weight loss itself.  A modified low-carb diet might be a better option.  In general, small steady weight loss with permanent healthy lifestyle changes is usually more sustainable than drastic weight loss in a short period of time. If he feels that the keto diet is the best option for him for now to lose weight, the achieved weight loss might counteract the effects of metabolic abnormalities caused by this diet.

## 2019-12-19 NOTE — Telephone Encounter (Signed)
Spoke with the patient and made him aware of Dr. Arida's response and recommendation. Patient verbalized understanding and voiced appreciation for the call back. 

## 2020-02-29 ENCOUNTER — Other Ambulatory Visit: Payer: Self-pay

## 2020-02-29 ENCOUNTER — Encounter
Admission: RE | Admit: 2020-02-29 | Discharge: 2020-02-29 | Disposition: A | Discharge status: Home / Self Care | Payer: Managed Care, Other (non HMO) | Source: Ambulatory Visit | Attending: Urology | Admitting: Urology

## 2020-02-29 NOTE — Patient Instructions (Signed)
Your procedure is scheduled on: 03/07/20 Report to DAY SURGERY DEPARTMENT LOCATED ON 2ND FLOOR MEDICAL MALL ENTRANCE. To find out your arrival time please call [phone] between 1PM - 3PM on 03/06/20.  Remember: Instructions that are not followed completely may result in serious medical risk, up to and including death, or upon the discretion of your surgeon and anesthesiologist your surgery may need to be rescheduled.     _X__ 1. Do not eat food after midnight the night before your procedure.                 No gum chewing or hard candies. You may drink clear liquids up to 2 hours                 before you are scheduled to arrive for your surgery- DO not drink clear                 liquids within 2 hours of the start of your surgery.                 Clear Liquids include:  water, apple juice without pulp, clear carbohydrate                 drink such as Clearfast or Gatorade, Black Coffee or Tea (Do not add                 anything to coffee or tea). Diabetics water only  __X__2.  On the morning of surgery brush your teeth with toothpaste and water, you                 may rinse your mouth with mouthwash if you wish.  Do not swallow any              toothpaste of mouthwash.     _X__ 3.  No Alcohol for 24 hours before or after surgery.   _X__ 4.  Do Not Smoke or use e-cigarettes For 24 Hours Prior to Your Surgery.                 Do not use any chewable tobacco products for at least 6 hours prior to                 surgery.  ____  5.  Bring all medications with you on the day of surgery if instructed.   __X__  6.  Notify your doctor if there is any change in your medical condition      (cold, fever, infections).     Do not wear jewelry, make-up, hairpins, clips or nail polish. Do not wear lotions, powders, or perfumes.  Do not shave 48 hours prior to surgery. Men may shave face and neck. Do not bring valuables to the hospital.    [facility] is not responsible for any belongings or  valuables.  Contacts, dentures/partials or body piercings may not be worn into surgery. Bring a case for your contacts, glasses or hearing aids, a denture cup will be supplied. Leave your suitcase in the car. After surgery it may be brought to your room. For patients admitted to the hospital, discharge time is determined by your treatment team.   Patients discharged the day of surgery will not be allowed to drive home.   Please read over the following fact sheets that you were given:   MRSA Information  __X__ Take these medicines the morning of surgery with A SIP OF WATER:  1. flecainide (TAMBOCOR) 150 MG tablet  2.   3.   4.  5.  6.  ____ Fleet Enema (as directed)   __X__ Use CHG Soap/SAGE wipes as directed  ____ Use inhalers on the day of surgery  ____ Stop metformin/Janumet/Farxiga 2 days prior to surgery    ____ Take 1/2 of usual insulin dose the night before surgery. No insulin the morning          of surgery.   __X__ Stop Blood Thinners /Aspirin  on (PER DR WOLFF 3 DAYS)  Or contact your Surgeon, Cardiologist or Medical Doctor regarding  ability to stop your blood thinners  __X__ Stop Anti-inflammatories 7 days before surgery such as Advil, Ibuprofen, Motrin,  BC or Goodies Powder, Naprosyn, Naproxen, Aleve, Aspirin   Pt aware to stop now 02/29/20 __X__ Stop all herbal supplements, fish oil or vitamin E until after surgery.    ____ Bring C-Pap to the hospital.

## 2020-03-05 ENCOUNTER — Other Ambulatory Visit: Payer: Self-pay

## 2020-03-05 ENCOUNTER — Other Ambulatory Visit
Admission: RE | Admit: 2020-03-05 | Discharge: 2020-03-05 | Disposition: A | Discharge status: Home / Self Care | Payer: Managed Care, Other (non HMO) | Source: Ambulatory Visit | Attending: Urology | Admitting: Urology

## 2020-03-05 DIAGNOSIS — R3915 Urgency of urination: Secondary | ICD-10-CM | POA: Diagnosis not present

## 2020-03-05 DIAGNOSIS — I4891 Unspecified atrial fibrillation: Secondary | ICD-10-CM | POA: Insufficient documentation

## 2020-03-05 DIAGNOSIS — Z01818 Encounter for other preprocedural examination: Secondary | ICD-10-CM | POA: Insufficient documentation

## 2020-03-05 DIAGNOSIS — N138 Other obstructive and reflux uropathy: Secondary | ICD-10-CM | POA: Insufficient documentation

## 2020-03-05 DIAGNOSIS — G2581 Restless legs syndrome: Secondary | ICD-10-CM | POA: Insufficient documentation

## 2020-03-05 DIAGNOSIS — R001 Bradycardia, unspecified: Secondary | ICD-10-CM | POA: Insufficient documentation

## 2020-03-05 DIAGNOSIS — R350 Frequency of micturition: Secondary | ICD-10-CM | POA: Diagnosis not present

## 2020-03-05 DIAGNOSIS — Z7982 Long term (current) use of aspirin: Secondary | ICD-10-CM | POA: Insufficient documentation

## 2020-03-05 DIAGNOSIS — Z01810 Encounter for preprocedural cardiovascular examination: Secondary | ICD-10-CM

## 2020-03-05 DIAGNOSIS — Z20822 Contact with and (suspected) exposure to covid-19: Secondary | ICD-10-CM | POA: Insufficient documentation

## 2020-03-05 DIAGNOSIS — R3912 Poor urinary stream: Secondary | ICD-10-CM | POA: Diagnosis not present

## 2020-03-05 DIAGNOSIS — R3914 Feeling of incomplete bladder emptying: Secondary | ICD-10-CM | POA: Diagnosis not present

## 2020-03-05 DIAGNOSIS — F329 Major depressive disorder, single episode, unspecified: Secondary | ICD-10-CM | POA: Diagnosis not present

## 2020-03-05 DIAGNOSIS — Z888 Allergy status to other drugs, medicaments and biological substances status: Secondary | ICD-10-CM | POA: Insufficient documentation

## 2020-03-05 DIAGNOSIS — R9431 Abnormal electrocardiogram [ECG] [EKG]: Secondary | ICD-10-CM | POA: Insufficient documentation

## 2020-03-05 DIAGNOSIS — F419 Anxiety disorder, unspecified: Secondary | ICD-10-CM | POA: Insufficient documentation

## 2020-03-05 DIAGNOSIS — G4733 Obstructive sleep apnea (adult) (pediatric): Secondary | ICD-10-CM | POA: Diagnosis not present

## 2020-03-05 DIAGNOSIS — N529 Male erectile dysfunction, unspecified: Secondary | ICD-10-CM | POA: Diagnosis not present

## 2020-03-05 DIAGNOSIS — Z87891 Personal history of nicotine dependence: Secondary | ICD-10-CM | POA: Insufficient documentation

## 2020-03-05 DIAGNOSIS — I4819 Other persistent atrial fibrillation: Secondary | ICD-10-CM | POA: Diagnosis not present

## 2020-03-05 DIAGNOSIS — N401 Enlarged prostate with lower urinary tract symptoms: Secondary | ICD-10-CM | POA: Diagnosis not present

## 2020-03-05 DIAGNOSIS — Z79899 Other long term (current) drug therapy: Secondary | ICD-10-CM | POA: Insufficient documentation

## 2020-03-05 DIAGNOSIS — K219 Gastro-esophageal reflux disease without esophagitis: Secondary | ICD-10-CM | POA: Diagnosis not present

## 2020-03-05 LAB — BASIC METABOLIC PANEL
Anion gap: 7 (ref 5–15)
BUN: 15 mg/dL (ref 6–20)
CO2: 26 mmol/L (ref 22–32)
Calcium: 9 mg/dL (ref 8.9–10.3)
Chloride: 104 mmol/L (ref 98–111)
Creatinine, Ser: 0.93 mg/dL (ref 0.61–1.24)
GFR calc Af Amer: >60 mL/min (ref >60)
GFR calc non Af Amer: >60 mL/min (ref >60)
Glucose, Bld: 91 mg/dL (ref 70–99)
Potassium: 3.9 mmol/L (ref 3.5–5.1)
Sodium: 137 mmol/L (ref 135–145)

## 2020-03-05 LAB — CBC
HCT: 39.3 % (ref 39.0–52.0)
Hemoglobin: 13 g/dL (ref 13.0–17.0)
MCH: 28.2 pg (ref 26.0–34.0)
MCHC: 33.1 g/dL (ref 30.0–36.0)
MCV: 85.2 fL (ref 80.0–100.0)
Platelets: 250 10*3/uL (ref 150–400)
RBC: 4.61 MIL/uL (ref 4.22–5.81)
RDW: 14.4 % (ref 11.5–15.5)
WBC: 4.4 10*3/uL (ref 4.0–10.5)
nRBC: 0 % (ref 0.0–0.2)

## 2020-03-05 LAB — SARS CORONAVIRUS 2 (TAT 6-24 HRS): SARS Coronavirus 2: NEGATIVE

## 2020-03-05 NOTE — H&P (Deleted)
  The note originally documented on this encounter has been moved the the encounter in which it belongs.  

## 2020-03-05 NOTE — H&P (Signed)
NAME: Aaron Watson, Aaron Watson MEDICAL RECORD [id number] ACCOUNT [id number] DATE OF BIRTH:16-Feb-1961 FACILITY: [facility] LOCATION: [facility] PHYSICIAN:Anokhi Shannon Gilles Chiquito, MD  HISTORY AND PHYSICAL  DATE OF ADMISSION:  03/07/2020  CHIEF COMPLAINT:  Difficulty voiding.  HISTORY OF PRESENT ILLNESS:  The patient is a 59 year old white male with a greater than 1-year history of significant lower urinary tract symptoms.  He could not tolerate tamsulosin.  He comes in now for UroLift.  Attempted UroLift in the office with  nitrous oxide was not successful due to pain.  He only had 2 implants placed on 12/04/2019 and continues to have persistent symptoms.  Symptoms at this time including incomplete emptying, frequency, intermittency, urgency, weak stream, straining and  nocturia.  Prostate ultrasound dated November 30th indicated a 39 cc prostate without median lobe and lateral lobe obstruction.  Maximum flow rate was 6 mL per second with an average flow rate of 4.8 mL per second and a postvoid residual of 97 mL based upon a  voided volume of 271 mL.  Most recent PSA was 1.5 ng/mL on October 24th.  Cystoscopy in December indicated 3 cm prostatic urethral length without median lobe.  PAST MEDICAL HISTORY:  ALLERGIES:  PREDNISONE.  CURRENT MEDICATIONS:  Flecainide, sertraline, Mirapex, aspirin, tadalafil and Valtrex.  PAST SURGICAL HISTORY:  C-spine repair 2019, bilateral foot surgery 2019.  PAST AND CURRENT MEDICAL CONDITIONS:   1.  Atrial fibrillation. 2.  Anxiety. 3.  Restless leg syndrome. 4.  BPH. 5.  Erectile dysfunction.  REVIEW OF SYSTEMS:  The patient has chronic joint discomfort.  He has decreased visual acuity.  He has chronic insomnia and shortness of breath.  He denies chest pain, diabetes or stroke.  SOCIAL HISTORY:  The patient quit smoking in 2005 with a 15-pack-year history.  He consumes 2 alcoholic beverages per week.  FAMILY HISTORY:  The patient's father is living, age 20  with peripheral neuropathy.  Mother is living, age 64 with joint disease.  There is no family history of prostate cancer.  PHYSICAL EXAMINATION: VITAL SIGNS:  Height was 5 feet 9, weight 202 pounds. GENERAL:  Well-nourished, white male in no acute distress. HEENT:  Sclerae were clear.  Pupils are equally round, reactive to light and accommodation.  Extraocular movements are intact. NECK:  No palpable masses or tenderness.  Thyroid gland was smooth without palpable nodules.  No audible carotid bruits. LYMPHATIC:  No palpable cervical or inguinal adenopathy. PULMONARY:  Lungs clear to auscultation. CARDIOVASCULAR:  Regular rhythm without audible murmurs. ABDOMEN:  Soft, nontender abdomen. GENITOURINARY:  Circumcised.  Testes were smooth, nontender. RECTAL:  35 gram smooth, nontender prostate. NEUROMUSCULAR:  Alert and oriented x3.  IMPRESSION:  Benign prostatic hypertrophy with bladder outlet obstruction.  PLAN:  UroLift procedure.  CN/NUANCE  D:03/05/2020 T:03/05/2020 JOB:[id number]

## 2020-03-07 ENCOUNTER — Ambulatory Visit: Payer: Managed Care, Other (non HMO) | Admitting: Registered Nurse

## 2020-03-07 ENCOUNTER — Ambulatory Visit
Admission: RE | Admit: 2020-03-07 | Discharge: 2020-03-07 | Disposition: A | Discharge status: Home / Self Care | Payer: Managed Care, Other (non HMO) | Source: Ambulatory Visit | Attending: Urology | Admitting: Urology

## 2020-03-07 ENCOUNTER — Encounter
Admission: RE | Disposition: A | Discharge status: Home / Self Care | Payer: Self-pay | Source: Ambulatory Visit | Attending: Urology

## 2020-03-07 ENCOUNTER — Encounter: Payer: Self-pay | Admitting: Urology

## 2020-03-07 ENCOUNTER — Other Ambulatory Visit: Payer: Self-pay

## 2020-03-07 DIAGNOSIS — N138 Other obstructive and reflux uropathy: Secondary | ICD-10-CM | POA: Insufficient documentation

## 2020-03-07 DIAGNOSIS — R3912 Poor urinary stream: Secondary | ICD-10-CM | POA: Insufficient documentation

## 2020-03-07 DIAGNOSIS — N401 Enlarged prostate with lower urinary tract symptoms: Secondary | ICD-10-CM | POA: Diagnosis not present

## 2020-03-07 DIAGNOSIS — G2581 Restless legs syndrome: Secondary | ICD-10-CM | POA: Insufficient documentation

## 2020-03-07 DIAGNOSIS — Z87891 Personal history of nicotine dependence: Secondary | ICD-10-CM | POA: Insufficient documentation

## 2020-03-07 DIAGNOSIS — R3914 Feeling of incomplete bladder emptying: Secondary | ICD-10-CM | POA: Insufficient documentation

## 2020-03-07 DIAGNOSIS — F329 Major depressive disorder, single episode, unspecified: Secondary | ICD-10-CM | POA: Insufficient documentation

## 2020-03-07 DIAGNOSIS — R350 Frequency of micturition: Secondary | ICD-10-CM | POA: Insufficient documentation

## 2020-03-07 DIAGNOSIS — Z20822 Contact with and (suspected) exposure to covid-19: Secondary | ICD-10-CM | POA: Insufficient documentation

## 2020-03-07 DIAGNOSIS — Z79899 Other long term (current) drug therapy: Secondary | ICD-10-CM | POA: Insufficient documentation

## 2020-03-07 DIAGNOSIS — N529 Male erectile dysfunction, unspecified: Secondary | ICD-10-CM | POA: Insufficient documentation

## 2020-03-07 DIAGNOSIS — I4819 Other persistent atrial fibrillation: Secondary | ICD-10-CM | POA: Insufficient documentation

## 2020-03-07 DIAGNOSIS — Z7982 Long term (current) use of aspirin: Secondary | ICD-10-CM | POA: Insufficient documentation

## 2020-03-07 DIAGNOSIS — R3915 Urgency of urination: Secondary | ICD-10-CM | POA: Insufficient documentation

## 2020-03-07 DIAGNOSIS — G4733 Obstructive sleep apnea (adult) (pediatric): Secondary | ICD-10-CM | POA: Insufficient documentation

## 2020-03-07 DIAGNOSIS — K219 Gastro-esophageal reflux disease without esophagitis: Secondary | ICD-10-CM | POA: Insufficient documentation

## 2020-03-07 HISTORY — PX: CYSTOSCOPY WITH INSERTION OF UROLIFT: SHX6678

## 2020-03-07 SURGERY — CYSTOSCOPY WITH INSERTION OF UROLIFT
Anesthesia: General

## 2020-03-07 MED ORDER — FAMOTIDINE 20 MG PO TABS: 20.0000 mg | ORAL_TABLET | Freq: Once | ORAL | Status: AC

## 2020-03-07 MED ORDER — MIDAZOLAM HCL 2 MG/2ML IJ SOLN
INTRAMUSCULAR | Status: DC | PRN
  Administered 2020-03-07: 2 mg via INTRAVENOUS

## 2020-03-07 MED ORDER — PROPOFOL 500 MG/50ML IV EMUL
INTRAVENOUS | Status: AC
  Filled 2020-03-07: qty 50

## 2020-03-07 MED ORDER — FENTANYL CITRATE (PF) 100 MCG/2ML IJ SOLN
INTRAMUSCULAR | Status: DC | PRN
  Administered 2020-03-07: 50 ug via INTRAVENOUS
  Administered 2020-03-07 (×2): 25 ug via INTRAVENOUS

## 2020-03-07 MED ORDER — SCOPOLAMINE 1 MG/3DAYS TD PT72
MEDICATED_PATCH | TRANSDERMAL | Status: AC
  Filled 2020-03-07: qty 1

## 2020-03-07 MED ORDER — SCOPOLAMINE 1 MG/3DAYS TD PT72
1.0000 | MEDICATED_PATCH | TRANSDERMAL | Status: DC
  Administered 2020-03-07: 1.5 mg via TRANSDERMAL

## 2020-03-07 MED ORDER — GLYCOPYRROLATE 0.2 MG/ML IJ SOLN
INTRAMUSCULAR | Status: DC | PRN
  Administered 2020-03-07: .2 mg via INTRAVENOUS

## 2020-03-07 MED ORDER — LIDOCAINE HCL (CARDIAC) PF 100 MG/5ML IV SOSY
PREFILLED_SYRINGE | INTRAVENOUS | Status: DC | PRN
  Administered 2020-03-07: 100 mg via INTRAVENOUS

## 2020-03-07 MED ORDER — DEXAMETHASONE SODIUM PHOSPHATE 10 MG/ML IJ SOLN
INTRAMUSCULAR | Status: DC | PRN
  Administered 2020-03-07: 10 mg via INTRAVENOUS

## 2020-03-07 MED ORDER — FENTANYL CITRATE (PF) 100 MCG/2ML IJ SOLN: 25.0000 ug | INTRAMUSCULAR | Status: DC | PRN

## 2020-03-07 MED ORDER — OXYCODONE HCL 5 MG PO TABS: 5.0000 mg | ORAL_TABLET | Freq: Once | ORAL | Status: DC | PRN

## 2020-03-07 MED ORDER — LIDOCAINE HCL URETHRAL/MUCOSAL 2 % EX GEL
CUTANEOUS | Status: DC | PRN
  Administered 2020-03-07: 1

## 2020-03-07 MED ORDER — PROMETHAZINE HCL 25 MG/ML IJ SOLN: 6.2500 mg | INTRAMUSCULAR | Status: DC | PRN

## 2020-03-07 MED ORDER — LACTATED RINGERS IV SOLN
INTRAVENOUS | Status: DC
  Administered 2020-03-07: 125 mL/h via INTRAVENOUS

## 2020-03-07 MED ORDER — FAMOTIDINE 20 MG PO TABS
ORAL_TABLET | ORAL | Status: AC
  Administered 2020-03-07: 20 mg via ORAL
  Filled 2020-03-07: qty 1

## 2020-03-07 MED ORDER — OXYCODONE HCL 5 MG/5ML PO SOLN: 5.0000 mg | Freq: Once | ORAL | Status: DC | PRN

## 2020-03-07 MED ORDER — ONDANSETRON HCL 4 MG/2ML IJ SOLN
INTRAMUSCULAR | Status: DC | PRN
  Administered 2020-03-07: 4 mg via INTRAVENOUS

## 2020-03-07 MED ORDER — URIBEL 118 MG PO CAPS: 1.0000 | ORAL_CAPSULE | Freq: Four times a day (QID) | ORAL | 3 refills | Status: DC | PRN

## 2020-03-07 MED ORDER — CEFAZOLIN SODIUM-DEXTROSE 1-4 GM/50ML-% IV SOLN
INTRAVENOUS | Status: DC | PRN
  Administered 2020-03-07: 1 g via INTRAVENOUS

## 2020-03-07 MED ORDER — CEFAZOLIN SODIUM-DEXTROSE 1-4 GM/50ML-% IV SOLN: 1.0000 g | Freq: Once | INTRAVENOUS | Status: DC

## 2020-03-07 MED ORDER — MIDAZOLAM HCL 2 MG/2ML IJ SOLN
INTRAMUSCULAR | Status: AC
  Filled 2020-03-07: qty 2

## 2020-03-07 MED ORDER — FENTANYL CITRATE (PF) 100 MCG/2ML IJ SOLN
INTRAMUSCULAR | Status: AC
  Filled 2020-03-07: qty 2

## 2020-03-07 MED ORDER — BELLADONNA ALKALOIDS-OPIUM 16.2-60 MG RE SUPP
RECTAL | Status: DC | PRN
  Administered 2020-03-07: 1 via RECTAL

## 2020-03-07 MED ORDER — CIPROFLOXACIN HCL 500 MG PO TABS: 500.0000 mg | ORAL_TABLET | Freq: Two times a day (BID) | ORAL | 0 refills | Status: DC

## 2020-03-07 MED ORDER — PROPOFOL 10 MG/ML IV BOLUS
INTRAVENOUS | Status: DC | PRN
  Administered 2020-03-07: 50 mg via INTRAVENOUS
  Administered 2020-03-07: 200 mg via INTRAVENOUS

## 2020-03-07 SURGICAL SUPPLY — 14 items
BAG DRAIN CYSTO-URO LG1000N (MISCELLANEOUS) ×2 IMPLANT
COVER WAND RF STERILE (DRAPES) ×2 IMPLANT
GLOVE BIO SURGEON STRL SZ7.5 (GLOVE) ×2 IMPLANT
GOWN STRL REUS W/ TWL LRG LVL3 (GOWN DISPOSABLE) ×1 IMPLANT
GOWN STRL REUS W/ TWL XL LVL3 (GOWN DISPOSABLE) ×1 IMPLANT
GOWN STRL REUS W/TWL LRG LVL3 (GOWN DISPOSABLE) ×1
GOWN STRL REUS W/TWL XL LVL3 (GOWN DISPOSABLE) ×1
KIT TURNOVER CYSTO (KITS) ×2 IMPLANT
PACK CYSTO AR (MISCELLANEOUS) ×2 IMPLANT
SET CYSTO W/LG BORE CLAMP LF (SET/KITS/TRAYS/PACK) ×2 IMPLANT
SURGILUBE 2OZ TUBE FLIPTOP (MISCELLANEOUS) ×2 IMPLANT
SYSTEM UROLIFT (Male Continence) ×4 IMPLANT
WATER STERILE IRR 1000ML POUR (IV SOLUTION) ×2 IMPLANT
WATER STERILE IRR 3000ML UROMA (IV SOLUTION) ×2 IMPLANT

## 2020-03-07 NOTE — Transfer of Care (Signed)
Immediate Anesthesia Transfer of Care Note  Patient: Aaron Watson  Procedure(s) Performed: Procedure(s): CYSTOSCOPY WITH INSERTION OF UROLIFT (N/A)  Patient Location: PACU  Anesthesia Type:General  Level of Consciousness: sedated  Airway & Oxygen Therapy: Patient Spontanous Breathing and Patient connected to face mask oxygen  Post-op Assessment: Report given to RN and Post -op Vital signs reviewed and stable  Post vital signs: Reviewed and stable  Last Vitals:  Vitals:   03/07/20 0755 03/07/20 0911  BP: (!) 145/90 111/71  Pulse: 78 82  Resp: 16 16  Temp: 36.7 C (!) 36.1 C  SpO2: 99% 99%    Complications: No apparent anesthesia complications

## 2020-03-07 NOTE — Anesthesia Procedure Notes (Signed)
Procedure Name: LMA Insertion Date/Time: 03/07/2020 8:36 AM Performed by: Ginger Carne, CRNA Pre-anesthesia Checklist: Patient identified, Emergency Drugs available, Suction available, Patient being monitored and Timeout performed Patient Re-evaluated:Patient Re-evaluated prior to induction Oxygen Delivery Method: Circle system utilized Preoxygenation: Pre-oxygenation with 100% oxygen Induction Type: IV induction Ventilation: Mask ventilation without difficulty LMA: LMA inserted LMA Size: 4.0 Tube type: Oral Number of attempts: 1 Placement Confirmation: positive ETCO2 and breath sounds checked- equal and bilateral Tube secured with: Tape Dental Injury: Teeth and Oropharynx as per pre-operative assessment

## 2020-03-07 NOTE — Discharge Instructions (Addendum)
AMBULATORY SURGERY  DISCHARGE INSTRUCTIONS   1) The drugs that you were given will stay in your system until tomorrow so for the next 24 hours you should not:  A) Drive an automobile B) Make any legal decisions C) Drink any alcoholic beverage   2) You may resume regular meals tomorrow.  Today it is better to start with liquids and gradually work up to solid foods.  You may eat anything you prefer, but it is better to start with liquids, then soup and crackers, and gradually work up to solid foods.   3) Please notify your doctor immediately if you have any unusual bleeding, trouble breathing, redness and pain at the surgery site, drainage, fever, or pain not relieved by medication.  4) Your post-operative visit with Dr.                                     is: Date:                        Time:    Please call to schedule your post-operative visit.  5) Additional Instructions: Dysuria Dysuria is pain or discomfort while urinating. The pain or discomfort may be felt in the part of your body that drains urine from the bladder (urethra) or in the surrounding tissue of the genitals. The pain may also be felt in the groin area, lower abdomen, or lower back. You may have to urinate frequently or have the sudden feeling that you have to urinate (urgency). Dysuria can affect both men and women, but it is more common in women. Dysuria can be caused by many different things, including:  Urinary tract infection.  Kidney stones or bladder stones.  Certain sexually transmitted infections (STIs), such as chlamydia.  Dehydration.  Inflammation of the tissues of the vagina.  Use of certain medicines.  Use of certain soaps or scented products that cause irritation. Follow these instructions at home: General instructions  Watch your condition for any changes.  Urinate often. Avoid holding urine for long periods of time.  After a bowel movement or urination, women should cleanse from front  to back, using each tissue only once.  Urinate after sexual intercourse.  Keep all follow-up visits as told by your health care provider. This is important.  If you had any tests done to find the cause of dysuria, it is up to you to get your test results. Ask your health care provider, or the department that is doing the test, when your results will be ready. Eating and drinking   Drink enough fluid to keep your urine pale yellow.  Avoid caffeine, tea, and alcohol. They can irritate the bladder and make dysuria worse. In men, alcohol may irritate the prostate. Medicines  Take over-the-counter and prescription medicines only as told by your health care provider.  If you were prescribed an antibiotic medicine, take it as told by your health care provider. Do not stop taking the antibiotic even if you start to feel better. Contact a health care provider if:  You have a fever.  You develop pain in your back or sides.  You have nausea or vomiting.  You have blood in your urine.  You are not urinating as often as you usually do. Get help right away if:  Your pain is severe and not relieved with medicines.  You cannot eat or drink without  vomiting.  You are confused.  You have a rapid heartbeat while at rest.  You have shaking or chills.  You feel extremely weak. Summary  Dysuria is pain or discomfort while urinating. Many different conditions can lead to dysuria.  If you have dysuria, you may have to urinate frequently or have the sudden feeling that you have to urinate (urgency).  Watch your condition for any changes. Keep all follow-up visits as told by your health care provider.  Make sure that you urinate often and drink enough fluid to keep your urine pale yellow. This information is not intended to replace advice given to you by your health care provider. Make sure you discuss any questions you have with your health care provider. Document Revised: 11/12/2017  Document Reviewed: 09/16/2017 Elsevier Patient Education  2020 [employer] Benign Prostatic Hyperplasia  Benign prostatic hyperplasia (BPH) is an enlarged prostate gland that is caused by the normal aging process and not by cancer. The prostate is a walnut-sized gland that is involved in the production of semen. It is located in front of the rectum and below the bladder. The bladder stores urine and the urethra is the tube that carries the urine out of the body. The prostate may get bigger as a man gets older. An enlarged prostate can press on the urethra. This can make it harder to pass urine. The build-up of urine in the bladder can cause infection. Back pressure and infection may progress to bladder damage and kidney (renal) failure. What are the causes? This condition is part of a normal aging process. However, not all men develop problems from this condition. If the prostate enlarges away from the urethra, urine flow will not be blocked. If it enlarges toward the urethra and compresses it, there will be problems passing urine. What increases the risk? This condition is more likely to develop in men over the age of 50 years. What are the signs or symptoms? Symptoms of this condition include:  Getting up often during the night to urinate.  Needing to urinate frequently during the day.  Difficulty starting urine flow.  Decrease in size and strength of your urine stream.  Leaking (dribbling) after urinating.  Inability to pass urine. This needs immediate treatment.  Inability to completely empty your bladder.  Pain when you pass urine. This is more common if there is also an infection.  Urinary tract infection (UTI). How is this diagnosed? This condition is diagnosed based on your medical history, a physical exam, and your symptoms. Tests will also be done, such as:  A post-void bladder scan. This measures any amount of urine that may remain in your bladder after you finish  urinating.  A digital rectal exam. In a rectal exam, your health care provider checks your prostate by putting a lubricated, gloved finger into your rectum to feel the back of your prostate gland. This exam detects the size of your gland and any abnormal lumps or growths.  An exam of your urine (urinalysis).  A prostate specific antigen (PSA) screening. This is a blood test used to screen for prostate cancer.  An ultrasound. This test uses sound waves to electronically produce a picture of your prostate gland. Your health care provider may refer you to a specialist in kidney and prostate diseases (urologist). How is this treated? Once symptoms begin, your health care provider will monitor your condition (active surveillance or watchful waiting). Treatment for this condition will depend on the severity of your condition. Treatment  may include:  Observation and yearly exams. This may be the only treatment needed if your condition and symptoms are mild.  Medicines to relieve your symptoms, including: ? Medicines to shrink the prostate. ? Medicines to relax the muscle of the prostate.  Surgery in severe cases. Surgery may include: ? Prostatectomy. In this procedure, the prostate tissue is removed completely through an open incision or with a laparoscope or robotics. ? Transurethral resection of the prostate (TURP). In this procedure, a tool is inserted through the opening at the tip of the penis (urethra). It is used to cut away tissue of the inner core of the prostate. The pieces are removed through the same opening of the penis. This removes the blockage. ? Transurethral incision (TUIP). In this procedure, small cuts are made in the prostate. This lessens the prostate's pressure on the urethra. ? Transurethral microwave thermotherapy (TUMT). This procedure uses microwaves to create heat. The heat destroys and removes a small amount of prostate tissue. ? Transurethral needle ablation (TUNA).  This procedure uses radio frequencies to destroy and remove a small amount of prostate tissue. ? Interstitial laser coagulation (Ramos). This procedure uses a laser to destroy and remove a small amount of prostate tissue. ? Transurethral electrovaporization (TUVP). This procedure uses electrodes to destroy and remove a small amount of prostate tissue. ? Prostatic urethral lift. This procedure inserts an implant to push the lobes of the prostate away from the urethra. Follow these instructions at home:  Take over-the-counter and prescription medicines only as told by your health care provider.  Monitor your symptoms for any changes. Contact your health care provider with any changes.  Avoid drinking large amounts of liquid before going to bed or out in public.  Avoid or reduce how much caffeine or alcohol you drink.  Give yourself time when you urinate.  Keep all follow-up visits as told by your health care provider. This is important. Contact a health care provider if:  You have unexplained back pain.  Your symptoms do not get better with treatment.  You develop side effects from the medicine you are taking.  Your urine becomes very dark or has a bad smell.  Your lower abdomen becomes distended and you have trouble passing your urine. Get help right away if:  You have a fever or chills.  You suddenly cannot urinate.  You feel lightheaded, or very dizzy, or you faint.  There are large amounts of blood or clots in the urine.  Your urinary problems become hard to manage.  You develop moderate to severe low back or flank pain. The flank is the side of your body between the ribs and the hip. These symptoms may represent a serious problem that is an emergency. Do not wait to see if the symptoms will go away. Get medical help right away. Call your local emergency services (911 in the [place] Do not drive yourself to the hospital. Summary  Benign prostatic hyperplasia (BPH) is an  enlarged prostate that is caused by the normal aging process and not by cancer.  An enlarged prostate can press on the urethra. This can make it hard to pass urine.  This condition is part of a normal aging process and is more likely to develop in men over the age of 61 years.  Get help right away if you suddenly cannot urinate. This information is not intended to replace advice given to you by your health care provider. Make sure you discuss any questions you have  with your health care provider. Document Revised: 10/25/2018 Document Reviewed: 01/04/2017 Elsevier Patient Education  2020 [employer].

## 2020-03-07 NOTE — H&P (Signed)
Date of Initial H&P: 03/05/20  History reviewed, patient examined, no change in status, stable for surgery.

## 2020-03-07 NOTE — Anesthesia Postprocedure Evaluation (Signed)
Anesthesia Post Note  Patient: Aaron Watson  Procedure(s) Performed: CYSTOSCOPY WITH INSERTION OF UROLIFT (N/A )  Patient location during evaluation: PACU Anesthesia Type: General Level of consciousness: awake and alert Pain management: pain level controlled Vital Signs Assessment: post-procedure vital signs reviewed and stable Respiratory status: spontaneous breathing and respiratory function stable Cardiovascular status: stable Anesthetic complications: no     Last Vitals:  Vitals:   03/07/20 0956 03/07/20 1022  BP: (!) 149/99 (!) 159/101  Pulse: 87 80  Resp: 16 16  Temp: (!) 36.1 C (!) 36.1 C  SpO2: 98% 98%    Last Pain:  Vitals:   03/07/20 1022  TempSrc: Temporal  PainSc: 0-No pain                 Damien Cisar K

## 2020-03-07 NOTE — Op Note (Signed)
Preoperative diagnosis: BPH with bladder outlet obstruction (N40.1) Postoperative diagnosis: Same  Procedure: Urolift implants ( [id number] and [id number] x3)  Surgeon: Suszanne Conners. Evelene Croon MD  Anesthesia: General  Indications:See the history and physical. After informed consent the above procedure(s) were requested     Technique and findings: After adequate general anesthesia had been obtained the patient was placed into dorsal lithotomy position and the perineum was prepped and draped in the usual fashion.  The UroLift cystoscope was coupled to the camera and visually advanced into the bladder.  Bladder was thoroughly inspected.  Both ureteral orifices were identified and had clear efflux.  No bladder mucosal lesions were identified.  Scope was then backed into the prostatic fossa and lateral lobe prostatic hypertrophy was noted.  BPH tissue just distal to the bladder neck appeared compressed from initial UroLift implant.  Mid prostatic and apical tissue was still obstructive.  The initial 2 UroLift implants were then placed at the 2:00 and 10:00 positions at the level of the verumontanum.  The next 2 implants were placed at the 2:00 and 10:00 positions in the mid prostate.  Cystoscopy was then performed and the prostatic fossa was noted to be widely patent without any obstruction.  No injuries to the urethra or bladder were identified.  The bladder was drained and cystoscope was then removed.  10 cc of viscous Xylocaine was instilled within the urethra.  A B&O suppository was placed.  The procedure was then terminated and patient transferred to the recovery room in stable condition.

## 2020-03-07 NOTE — Anesthesia Preprocedure Evaluation (Signed)
Anesthesia Evaluation  Patient identified by MRN, date of birth, ID band Patient awake    Reviewed: Allergy & Precautions, H&P , NPO status , Patient's Chart, lab work & pertinent test results  History of Anesthesia Complications (+) PONV and history of anesthetic complications  Airway Mallampati: III  TM Distance: >3 FB Neck ROM: full    Dental  (+) Chipped   Pulmonary neg shortness of breath, sleep apnea and Continuous Positive Airway Pressure Ventilation , former smoker,           Cardiovascular Exercise Tolerance: Good (-) angina(-) Past MI and (-) DOE negative cardio ROS       Neuro/Psych PSYCHIATRIC DISORDERS negative neurological ROS     GI/Hepatic Neg liver ROS, GERD  Medicated and Controlled,  Endo/Other  negative endocrine ROS  Renal/GU      Musculoskeletal  (+) Arthritis ,   Abdominal   Peds  Hematology negative hematology ROS (+)   Anesthesia Other Findings Past Medical History: No date: Anxiety No date: Arthritis     Comment:  osteoarthritis No date: Depression No date: GERD (gastroesophageal reflux disease) No date: Itching No date: OSA on CPAP No date: Persistent atrial fibrillation (HCC)     Comment:  a. CHADS2ASc 0; b. flecainide  No date: PONV (postoperative nausea and vomiting)     Comment:  Zofran intraop works well for pt No date: Restless leg syndrome  Past Surgical History: 2008: CARDIAC CATHETERIZATION     Comment:  [facility]: 40% mid LAD lesion. Otherwise               insignificant/minimal CAD. 12/29/2017: CERVICAL DISC ARTHROPLASTY; N/A     Comment:  Procedure: CERVICAL ANTERIOR [place] ARTHROPLASTY-C5-7;                Surgeon: Meade Maw, MD;  Location: [facility] ORS;                Service: Neurosurgery;  Laterality: N/A; No date: FOOT SURGERY; Bilateral April 2008: NM MYOVIEW LTD     Comment:  Dr. Humphrey Rolls ([place]) : Inferior wall               defect, EF 55%.  -- Suggested to the due to ischemia in               RCA territory versus diaphragmatic attenuation normal EF. 2013: NM MYOVIEW LTD     Comment:  Treadmill Myoview [facility]) no evidence of ischemia or               infarction. Normal EF. No date: SHOULDER SURGERY; Left July 20 13th; April 2016: TRANSTHORACIC ECHOCARDIOGRAM     Comment:  At [facility]: a) normal LV function. Mild LA dilation, mild               LVH and mild TR/MR. b) Normal LV size and function. EF               55-60%. No RW MA. Trivial MR and TR. Otherwise normal No date: VASECTOMY  BMI    Body Mass Index: 26.90 kg/m      Reproductive/Obstetrics negative OB ROS                             Anesthesia Physical Anesthesia Plan  ASA: III  Anesthesia Plan: General LMA   Post-op Pain Management:    Induction: Intravenous  PONV Risk Score and Plan: Dexamethasone, Ondansetron, Midazolam,  Treatment may vary due to age or medical condition and Scopolamine patch - Pre-op  Airway Management Planned: LMA  Additional Equipment:   Intra-op Plan:   Post-operative Plan: Extubation in OR  Informed Consent: I have reviewed the patients History and Physical, chart, labs and discussed the procedure including the risks, benefits and alternatives for the proposed anesthesia with the patient or authorized representative who has indicated his/her understanding and acceptance.     Dental Advisory Given  Plan Discussed with: Anesthesiologist, CRNA and Surgeon  Anesthesia Plan Comments: (Patient consented for risks of anesthesia including but not limited to:  - adverse reactions to medications - damage to teeth, lips or other oral mucosa - sore throat or hoarseness - Damage to heart, brain, lungs or loss of life  Patient voiced understanding.)        Anesthesia Quick Evaluation

## 2020-03-07 NOTE — OR Nursing (Signed)
Silver colored wedding band given to mother [place] in lobby.

## 2020-07-29 ENCOUNTER — Other Ambulatory Visit: Payer: Self-pay | Admitting: Nurse Practitioner

## 2020-07-29 DIAGNOSIS — M5412 Radiculopathy, cervical region: Secondary | ICD-10-CM

## 2020-07-29 DIAGNOSIS — M542 Cervicalgia: Secondary | ICD-10-CM

## 2020-08-09 ENCOUNTER — Encounter: Payer: Self-pay | Admitting: Family Medicine

## 2020-08-09 ENCOUNTER — Ambulatory Visit (INDEPENDENT_AMBULATORY_CARE_PROVIDER_SITE_OTHER): Payer: Managed Care, Other (non HMO) | Admitting: Family Medicine

## 2020-08-09 ENCOUNTER — Other Ambulatory Visit: Payer: Self-pay

## 2020-08-09 VITALS — BP 111/67 | HR 80 | Temp 97.3°F | Resp 16 | Ht 71.0 in | Wt 190.0 lb

## 2020-08-09 DIAGNOSIS — F3342 Major depressive disorder, recurrent, in full remission: Secondary | ICD-10-CM

## 2020-08-09 DIAGNOSIS — E663 Overweight: Secondary | ICD-10-CM

## 2020-08-09 DIAGNOSIS — I4819 Other persistent atrial fibrillation: Secondary | ICD-10-CM | POA: Diagnosis not present

## 2020-08-09 DIAGNOSIS — Z9989 Dependence on other enabling machines and devices: Secondary | ICD-10-CM

## 2020-08-09 DIAGNOSIS — G4733 Obstructive sleep apnea (adult) (pediatric): Secondary | ICD-10-CM

## 2020-08-09 DIAGNOSIS — N401 Enlarged prostate with lower urinary tract symptoms: Secondary | ICD-10-CM

## 2020-08-09 DIAGNOSIS — E782 Mixed hyperlipidemia: Secondary | ICD-10-CM

## 2020-08-09 DIAGNOSIS — I480 Paroxysmal atrial fibrillation: Secondary | ICD-10-CM

## 2020-08-09 DIAGNOSIS — R3912 Poor urinary stream: Secondary | ICD-10-CM

## 2020-08-09 NOTE — Patient Instructions (Addendum)
Thank you for coming to the office today.  We can consider tapering down Sertraline. Let me know sooner if you want to discuss.  Medication Taper Off Instructions Current med - Sertraline 100mg   Week 1: Alternate every OTHER day - 100 mg (whole tab) and then next day 50 mg (HALF tab) Week 2-3: 50 mg daily (HALF tablet) Week 4: 50 mg every OTHER day (HALF tablet) - alternating day is NO medicine (skip dose) STOP completely  Optional weeks 5-6: you may extend the week 4 plan for up to 3 weeks if need. Alternating half pill one day and no pill the next. May space out to every 3 days if need.  Consider Escitalopram (Lexapro) in future as substitute SSRI in future, less ED.   Lab AFTER appointment will include PSA and lipids all labs.  DUE for FASTING BLOOD WORK (no food or drink after midnight before the lab appointment, only water or coffee without cream/sugar on the morning of)   Please schedule a Follow-up Appointment to: Return in about 4 weeks (around 09/06/2020) for Follow-up 4 weeks Annual Physical AM apt fasting lab AFTER.  If you have any other questions or concerns, please feel free to call the office or send a message through MyChart. You may also schedule an earlier appointment if necessary.  Additionally, you may be receiving a survey about your experience at our office within a few days to 1 week by e-mail or mail. We value your feedback.  09/08/2020, DO [facility], [facility]

## 2020-08-09 NOTE — Progress Notes (Signed)
Subjective:    Patient ID: Aaron Watson, male    DOB: 08-31-1961, 59 y.o.   MRN: [id number]  Aaron Watson is a 59 y.o. 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 Awaiting MRI Recently has had some worse headache neck pain  BPH LUTS Followed by Dr Annie Sable, he has done Urolift 2021, painful, then finished in hospital, 50/50 improvement Tried Flomax in past, he does natural supplement for prostate - He has had PSA from prior PCP - He did a quick in office UA sample, only small blood POC seen  Paroxysmal Atrial Fibrillation Followed by Cardiology [facility] Dr Fletcher Anon Initial diagnosis 2008 He is asymptomatic, very rarely has any AFib flare. - He takes Flecanide 360m daily for controlling and preventing AFib - Has had ECHO monitoring - On antiplatelet ASA 81  RLS On on mirapex  OSA, on CPAP - Patient reports prior history of dx OSA and on CPAP for >5-10 years, had witnessed apnea, poor sleep. - [place] Today reports that sleep apnea is well controlled. He uses the CPAP machine every night. Tolerates the machine well, and thinks that sleeps better with it and feels good. No new concerns or symptoms.  Major Depression recurrent in remission He says prior diagnosis, but now he has had full remission no depression, relates it to stress with headaches associated with working as paramedic, now no longer doing that job and his headaches and stress have improved On SSRI  Sertraline still after years, he asks to taper down on med for now to see, has never been on other similar med  ED Takes Cialis PRN  Health Maintenance:  Colon CA Screening: last done reported 03/01/13 - cannot locate report in EHR. Was advised 10 year approx return, he will consider Cologuard option now for 7 years, then he can wait 3 years if negative and get colonoscopy. Will re-discuss at physical   [facility] 08/11/2020 04/29/2018 01/26/2018  Decreased Interest 0 0 0  Down, Depressed, Hopeless 0 0 0  PHQ - 2 Score 0 0 0  Altered sleeping 0 1 -  Tired, decreased energy 0 1 -  Change in appetite 0 0 -  Feeling bad or failure about yourself  0 0 -  Trouble concentrating 0 0 -  Moving slowly or fidgety/restless 0 0 -  Suicidal thoughts 0 0 -  PHQ-9 Score 0 2 -  Difficult doing work/chores Not difficult at all - -    Past Medical History:  Diagnosis Date  . Anxiety   . Arthritis    osteoarthritis  . Depression   . GERD (gastroesophageal reflux disease)   . Heart murmur   . Hyperlipidemia   . Itching   . OSA on CPAP   . Persistent atrial fibrillation ([place]    a. CHADS2ASc 0; b. flecainide   . PONV (postoperative nausea and vomiting)    Zofran intraop works well for pt  . Prostate disease   . Restless leg syndrome  Past Surgical History:  Procedure Laterality Date  . CARDIAC CATHETERIZATION  2008   [facility]: 40% mid LAD lesion. Otherwise insignificant/minimal CAD.  Marland Kitchen CERVICAL DISC ARTHROPLASTY N/A 12/29/2017   Procedure: CERVICAL ANTERIOR [place] ARTHROPLASTY-C5-7;  Surgeon: Aaron Maw, MD;  Location: [facility] ORS;  Service: Neurosurgery;  Laterality: N/A;  . CYSTOSCOPY WITH INSERTION OF UROLIFT N/A 03/07/2020   Procedure: CYSTOSCOPY WITH INSERTION OF UROLIFT;  Surgeon: Royston Cowper, MD;  Location: [facility] ORS;  Service: Urology;  Laterality: N/A;  . FOOT SURGERY Bilateral   . NM MYOVIEW LTD  April 2008   Dr. Humphrey Rolls ([place]) : Inferior wall  defect, EF 55%. -- Suggested to the due to ischemia in RCA territory versus diaphragmatic attenuation normal EF.  Marland Kitchen NM MYOVIEW LTD  2013   Treadmill Myoview [facility]) no evidence of ischemia or infarction. Normal EF.  Marland Kitchen SHOULDER SURGERY Left   . TRANSTHORACIC ECHOCARDIOGRAM  July 20 13th; April 2016   At [facility]: a) normal LV function. Mild LA dilation, mild LVH and mild TR/MR. b) Normal LV size and function. EF 55-60%. No RW MA. Trivial MR and TR. Otherwise normal  . VASECTOMY     Social History   Socioeconomic History  . Marital status: Married    Spouse name: Not on file  . Number of children: Not on file  . Years of education: 55  . Highest education level: Not on file  Occupational History  . 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 Smoking status: Former Smoker    Packs/day: 0.50    Years: 10.00    Pack years: 5.00    Types: Cigarettes  . Smokeless tobacco: Never Used  . Tobacco comment: Quit 10+ years ago.  Vaping Use  . Vaping Use: Never used  Substance and Sexual Activity  . Alcohol use: Yes    Alcohol/week: 0.0 standard drinks    Comment: Occasional  . Drug use: No  . Sexual activity: Not on file  Other Topics Concern  . 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   Moderate amount of daily caffeine use.   Social Determinants of Health   Financial Resource Strain:   . Difficulty of Paying Living Expenses: Not on file  Food Insecurity:   . Worried About Charity fundraiser in the Last Year: Not on file  . Ran Out of Food in the Last Year: Not on file  Transportation Needs:   . Lack of Transportation (Medical): Not on file  . Lack of Transportation (Non-Medical): Not on file  Physical Activity:   . Days of Exercise per Week: Not on file  . Minutes of Exercise per Session: Not on file  Stress:   . Feeling of Stress : Not on file   Social Connections:   . Frequency of Communication with Friends and Family: Not on file  . Frequency of Social Gatherings with Friends and Family: Not on file  . Attends Religious Services: Not on file  . Active Member of Clubs or Organizations: Not on file  . Attends Archivist Meetings: Not on file  . Marital Status: Not on file  Intimate Partner Violence:   . Fear of Current or Ex-Partner: Not on file  . Emotionally Abused: Not on file  . Physically Abused: Not on file  .  Sexually Abused: Not on file   Family History  Problem Relation Age of Onset  . Hypertension Mother   . Neuropathy Father   . Hypertension Father   . Depression Father   . Heart attack Maternal Grandfather    Current Outpatient Medications on File Prior to Visit  Medication Sig  . acetaminophen (TYLENOL) 500 MG tablet Take 1,000 mg by mouth every 6 (six) hours as needed for moderate pain.   Marland Kitchen aspirin EC 81 MG tablet Take 81 mg by mouth daily.  . flecainide (TAMBOCOR) 150 MG tablet Take 1 tablet (150 mg total) by mouth 2 (two) times daily.  Marland Kitchen ibuprofen (ADVIL,MOTRIN) 200 MG tablet Take 400 mg by mouth every 8 (eight) hours as needed for moderate pain.   . Multiple Vitamin (MULTIVITAMIN WITH MINERALS) TABS tablet Take 1 tablet by mouth daily.  . pramipexole (MIRAPEX) 0.5 MG tablet Take 0.5 mg by mouth in the morning and at bedtime.   . Probiotic Product (PROBIOTIC PO) Take 1 capsule by mouth daily.   . sertraline (ZOLOFT) 100 MG tablet TAKE 1 TABLET(100 MG) BY MOUTH DAILY (Patient taking differently: Take 100 mg by mouth daily. )  . tadalafil (CIALIS) 5 MG tablet Take 1 tablet (5 mg total) by mouth daily as needed for erectile dysfunction.  . valACYclovir (VALTREX) 500 MG tablet TAKE 1 TABLET(500 MG) BY MOUTH DAILY (Patient taking differently: Take 500 mg by mouth daily. )   No current facility-administered medications on file prior to visit.    Review of Systems Per HPI unless specifically  indicated above     Objective:    BP 111/67   Pulse 80   Temp (!) 97.3 F (36.3 C) (Temporal)   Resp 16   Ht 5' 11"  (1.803 m)   Wt 190 lb (86.2 kg)   SpO2 98%   BMI 26.50 kg/m   Wt Readings from Last 3 Encounters:  08/09/20 190 lb (86.2 kg)  03/07/20 192 lb 14.4 oz (87.5 kg)  02/29/20 193 lb (87.5 kg)    Physical Exam Vitals and nursing note reviewed.  Constitutional:      General: He is not in acute distress.    Appearance: He is well-developed. He is not diaphoretic.     Comments: Well-appearing, comfortable, cooperative  HENT:     Head: Normocephalic and atraumatic.  Eyes:     General:        Right eye: No discharge.        Left eye: No discharge.     Conjunctiva/sclera: Conjunctivae normal.  Neck:     Thyroid: No thyromegaly.  Cardiovascular:     Rate and Rhythm: Normal rate and regular rhythm.     Heart sounds: Normal heart sounds. No murmur heard.   Pulmonary:     Effort: Pulmonary effort is normal. No respiratory distress.     Breath sounds: Normal breath sounds. No wheezing or rales.  Musculoskeletal:        General: Normal range of motion.     Cervical back: Normal range of motion and neck supple.  Lymphadenopathy:     Cervical: No cervical adenopathy.  Skin:    General: Skin is warm and dry.     Findings: No erythema or rash.  Neurological:     Mental Status: He is alert and oriented to person, place, and time.  Psychiatric:        Behavior: Behavior normal.     Comments: Well groomed, good eye contact, normal speech  and thoughts       Results for orders placed or performed during the hospital encounter of 03/05/20  SARS CORONAVIRUS 2 (TAT 6-24 HRS) Nasopharyngeal Nasopharyngeal Swab   Specimen: Nasopharyngeal Swab  Result Value Ref Range   SARS Coronavirus 2 NEGATIVE NEGATIVE  Basic metabolic panel  Result Value Ref Range   Sodium 137 135 - 145 mmol/L   Potassium 3.9 3.5 - 5.1 mmol/L   Chloride 104 98 - 111 mmol/L   CO2 26 22 - 32 mmol/L    Glucose, Bld 91 70 - 99 mg/dL   BUN 15 6 - 20 mg/dL   Creatinine, Ser 0.93 0.61 - 1.24 mg/dL   Calcium 9.0 8.9 - 10.3 mg/dL   GFR calc non Af Amer >60 >60 mL/min   GFR calc Af Amer >60 >60 mL/min   Anion gap 7 5 - 15  CBC  Result Value Ref Range   WBC 4.4 4.0 - 10.5 K/uL   RBC 4.61 4.22 - 5.81 MIL/uL   Hemoglobin 13.0 13.0 - 17.0 g/dL   HCT 39.3 39 - 52 %   MCV 85.2 80.0 - 100.0 fL   MCH 28.2 26.0 - 34.0 pg   MCHC 33.1 30.0 - 36.0 g/dL   RDW 14.4 11.5 - 15.5 %   Platelets 250 150 - 400 K/uL   nRBC 0.0 0.0 - 0.2 %      Assessment & Plan:   Problem List Items Addressed This Visit    RESOLVED: Recurrent major depressive disorder, in full remission ([place])    Resolved No clear diagnosis in past, has had some mixed mood symptoms and stress headaches 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 On SSRI for headaches it has worked but now no stress Inquires about tapering off med for a trial off now after few years Instructions given F/u      Paroxysmal atrial fibrillation (HCC)    Stable, controlled without episodic flares of AFib Followed by Dr Fletcher Anon Cardiology On anti arrhythmic Flecainide       Overweight (BMI 25.0-29.9)   OSA on CPAP   Mixed hyperlipidemia    Last lipid 2019 Due for repeat lipid panel Has history of mixed hyperlipidemia Off statin, crestor      BPH (benign prostatic hyperplasia)    Followed by Dr Maryan Puls [place] [facility] S/p Urolift 2021 some improvement BPH with LUTS On natural remedy Cannot take Flomax       Other Visit Diagnoses    Persistent atrial fibrillation ([place])   (Chronic)  -  Primary     Review records in EHR from other specialist and prior PCP. Received some records from PCP will scan.    No orders of the defined types were placed in this encounter.     Follow up plan: Return in about 4 weeks (around 09/06/2020) for Follow-up 4 weeks Annual Physical AM apt fasting lab AFTER.   Future will plan to order CMET, Lipid, CBC, PSA,  TSH  Nobie Putnam, DO [facility] 08/09/2020, 3:23 PM

## 2020-08-11 DIAGNOSIS — E663 Overweight: Secondary | ICD-10-CM | POA: Insufficient documentation

## 2020-08-11 DIAGNOSIS — F3342 Major depressive disorder, recurrent, in full remission: Secondary | ICD-10-CM | POA: Insufficient documentation

## 2020-08-11 NOTE — Assessment & Plan Note (Signed)
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

## 2020-08-11 NOTE — Assessment & Plan Note (Signed)
Last lipid 2019 Due for repeat lipid panel Has history of mixed hyperlipidemia Off statin, crestor

## 2020-08-11 NOTE — Assessment & Plan Note (Signed)
Stable, controlled without episodic flares of AFib Followed by Dr Kirke Corin Cardiology On anti arrhythmic Flecainide

## 2020-08-11 NOTE — Assessment & Plan Note (Signed)
Followed by Dr Anola Gurney [place] [facility] S/p Urolift 2021 some improvement BPH with LUTS On natural remedy Cannot take Flomax

## 2020-08-16 ENCOUNTER — Ambulatory Visit: Payer: Managed Care, Other (non HMO)

## 2020-09-12 ENCOUNTER — Ambulatory Visit (INDEPENDENT_AMBULATORY_CARE_PROVIDER_SITE_OTHER): Payer: Managed Care, Other (non HMO) | Admitting: Family Medicine

## 2020-09-12 ENCOUNTER — Encounter: Payer: Self-pay | Admitting: Family Medicine

## 2020-09-12 ENCOUNTER — Other Ambulatory Visit: Payer: Self-pay

## 2020-09-12 VITALS — BP 118/75 | HR 63 | Temp 97.5°F | Resp 16 | Ht 71.0 in | Wt 195.6 lb

## 2020-09-12 DIAGNOSIS — Z0000 Encounter for general adult medical examination without abnormal findings: Secondary | ICD-10-CM | POA: Diagnosis not present

## 2020-09-12 DIAGNOSIS — B001 Herpesviral vesicular dermatitis: Secondary | ICD-10-CM | POA: Diagnosis not present

## 2020-09-12 DIAGNOSIS — G2581 Restless legs syndrome: Secondary | ICD-10-CM | POA: Diagnosis not present

## 2020-09-12 DIAGNOSIS — E782 Mixed hyperlipidemia: Secondary | ICD-10-CM

## 2020-09-12 DIAGNOSIS — F3342 Major depressive disorder, recurrent, in full remission: Secondary | ICD-10-CM | POA: Diagnosis not present

## 2020-09-12 DIAGNOSIS — N401 Enlarged prostate with lower urinary tract symptoms: Secondary | ICD-10-CM

## 2020-09-12 DIAGNOSIS — R7309 Other abnormal glucose: Secondary | ICD-10-CM

## 2020-09-12 DIAGNOSIS — Z1211 Encounter for screening for malignant neoplasm of colon: Secondary | ICD-10-CM

## 2020-09-12 DIAGNOSIS — G4733 Obstructive sleep apnea (adult) (pediatric): Secondary | ICD-10-CM

## 2020-09-12 DIAGNOSIS — E663 Overweight: Secondary | ICD-10-CM

## 2020-09-12 DIAGNOSIS — Z125 Encounter for screening for malignant neoplasm of prostate: Secondary | ICD-10-CM

## 2020-09-12 DIAGNOSIS — N529 Male erectile dysfunction, unspecified: Secondary | ICD-10-CM

## 2020-09-12 DIAGNOSIS — I480 Paroxysmal atrial fibrillation: Secondary | ICD-10-CM

## 2020-09-12 DIAGNOSIS — R3912 Poor urinary stream: Secondary | ICD-10-CM

## 2020-09-12 DIAGNOSIS — Z9989 Dependence on other enabling machines and devices: Secondary | ICD-10-CM

## 2020-09-12 MED ORDER — VALACYCLOVIR HCL 500 MG PO TABS: 500.0000 mg | ORAL_TABLET | Freq: Every day | ORAL | 3 refills | Status: DC

## 2020-09-12 MED ORDER — TADALAFIL 5 MG PO TABS: 5.0000 mg | ORAL_TABLET | Freq: Every day | ORAL | 3 refills | Status: DC | PRN

## 2020-09-12 MED ORDER — PRAMIPEXOLE DIHYDROCHLORIDE 0.5 MG PO TABS: 0.5000 mg | ORAL_TABLET | Freq: Two times a day (BID) | ORAL | 3 refills | Status: DC

## 2020-09-12 NOTE — Assessment & Plan Note (Signed)
Stable, controlled without episodic flares of AFib Followed by Dr Arida Cardiology On anti arrhythmic Flecainide  

## 2020-09-12 NOTE — Assessment & Plan Note (Signed)
Followed by Dr Anola Gurney [place] [facility] S/p Urolift 2021 some improvement BPH with LUTS On natural remedy Cannot take Flomax  Will re order Tadalafil, discussed possibility of daily dosing for BPH

## 2020-09-12 NOTE — Progress Notes (Signed)
Subjective:    Patient ID: Aaron Watson, male    DOB: Dec 15, 1960, 59 y.o.   MRN: [id number]  Aaron Watson is a 59 y.o. male presenting on 09/12/2020 for Annual Exam   HPI   Here for Annual Physical  Specialists Neurosurgery - Patsey [facility] Neurosurgery) Urology - Dr Anola Gurney [facility] Urology) Cardiology - Dr Lorine Bears (Cardiology)   Lifestyle / Overweight BMI >27 He dislikes restrictive diets, his wife does weight watchers, he does some walking exercise His goal is to update eating habits.  Neck Pain / Cervical Radiculitis Following with [facility]  BPH LUTS Followed by Dr Shirlee More, he has done Urolift 2021, painful, then finished in hospital, 50/50 improvement Tried Flomax in past, he does natural supplement for prostate - He has had PSA from prior PCP - He did a quick in office UA sample, only small blood POC seen - Takes Tadalafil PRN ED  Paroxysmal Atrial Fibrillation Followed by Cardiology [facility] Dr Kirke Corin Initial diagnosis 2008 He is asymptomatic, very rarely has any AFib flare. - He takes Flecanide 300mg  daily for controlling and preventing AFib - Has had ECHO monitoring - On antiplatelet ASA 81  RLS On on mirapex, needs refill  OSA, on CPAP - Patient reports prior history of dx OSA and on CPAP for >5-10 years, had witnessed apnea, poor sleep. - Recently had updated sleep study done at [facility] - Today reports that sleep apnea is well controlled. He uses the CPAP machine every night. Tolerates the machine well, and thinks that sleeps better with it and feels good. No new concerns or symptoms.  Major Depression recurrent in remission Last visit 1 month ago, initiated taper off Sertraline 100mg , did not think he needed it anymore, has done well on taper, now off for 1 week, remains off SSRI.  Recurrent Cold Sores On Valtrex 500mg  daily suppression Last cold sore > 2 years. Needs refill  ED Takes  Tadalafil 5mg  PRN with good results, needs refill  Health Maintenance:  Colon CA Screening: last done reported 03/01/13 - cannot locate report in EHR. Was advised 10 year approx return, he will consider Cologuard option now for 7 years, then he can wait 3 years if negative and get colonoscopy.  - Interested in Cologuard today  Depression screen [facility] 2/9 09/12/2020 08/11/2020 04/29/2018  Decreased Interest 0 0 0  Down, Depressed, Hopeless 0 0 0  PHQ - 2 Score 0 0 0  Altered sleeping 0 0 1  Tired, decreased energy 0 0 1  Change in appetite 0 0 0  Feeling bad or failure about yourself  0 0 0  Trouble concentrating 0 0 0  Moving slowly or fidgety/restless 0 0 0  Suicidal thoughts 0 0 0  PHQ-9 Score 0 0 2  Difficult doing work/chores Not difficult at all Not difficult at all -   No flowsheet data found.    Past Medical History:  Diagnosis Date  . Anxiety   . Arthritis    osteoarthritis  . Depression   . GERD (gastroesophageal reflux disease)   . Heart murmur   . Hyperlipidemia   . Itching   . OSA on CPAP   . PONV (postoperative nausea and vomiting)    Zofran intraop works well for pt  . Prostate disease   . Restless leg syndrome    Past Surgical History:  Procedure Laterality Date  . CARDIAC CATHETERIZATION  2008   [facility]: 40% mid LAD  lesion. Otherwise insignificant/minimal CAD.  Marland Kitchen. CERVICAL DISC ARTHROPLASTY N/A 12/29/2017   Procedure: CERVICAL ANTERIOR DISC ARTHROPLASTY-C5-7;  Surgeon: Venetia NightYarbrough, Chester, MD;  Location: [facility] ORS;  Service: Neurosurgery;  Laterality: N/A;  . CYSTOSCOPY WITH INSERTION OF UROLIFT N/A 03/07/2020   Procedure: CYSTOSCOPY WITH INSERTION OF UROLIFT;  Surgeon: Orson ApeWolff, Michael R, MD;  Location: [facility] ORS;  Service: Urology;  Laterality: N/A;  . FOOT SURGERY Bilateral   . NM MYOVIEW LTD  April 2008   Dr. Welton FlakesKhan ([facility]) : Inferior wall defect, EF 55%. -- Suggested to the due to ischemia in RCA territory versus diaphragmatic attenuation normal  EF.  Marland Kitchen. NM MYOVIEW LTD  2013   Treadmill Myoview [facility]) no evidence of ischemia or infarction. Normal EF.  Marland Kitchen. SHOULDER SURGERY Left   . TRANSTHORACIC ECHOCARDIOGRAM  July 20 13th; April 2016   [facility]: a) normal LV function. Mild LA dilation, mild LVH and mild TR/MR. b) Normal LV size and function. EF 55-60%. No RW MA. Trivial MR and TR. Otherwise normal  . VASECTOMY     Social History   Socioeconomic History  . Marital status: Married    Spouse name: Not on file  . Number of children: Not on file  . Years of education: 7214  . Highest education level: Not on file  Occupational History  . 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 Smoking status: Former Smoker    Packs/day: 0.50    Years: 10.00    Pack years: 5.00    Types: Cigarettes  . Smokeless tobacco: Never Used  . Tobacco comment: Quit 10+ years ago.  Vaping Use  . Vaping Use: Never used  Substance and Sexual Activity  . Alcohol use: Yes    Alcohol/week: 0.0 standard drinks    Comment: Occasional  . Drug use: No  . Sexual activity: Not on file  Other Topics Concern  . Not on file  Social History Narrative   He works as a [employer] at the [employer] in [place], [place]   Right-handed.   Moderate amount of daily caffeine use.   Social Determinants of Health   Financial Resource Strain:   . Difficulty of Paying Living Expenses: Not on file  Food Insecurity:   . Worried About Programme researcher, broadcasting/film/videounning Out of Food in the Last Year: Not on file  . Ran Out of Food in the Last Year: Not on file  Transportation Needs:   . Lack of Transportation (Medical): Not on file  . Lack of Transportation (Non-Medical): Not on file  Physical Activity:   . Days of Exercise per Week: Not on file  . Minutes of Exercise per Session: Not on file  Stress:   . Feeling of Stress : Not on file  Social Connections:   . Frequency of Communication with Friends and Family: Not on file  . Frequency of  Social Gatherings with Friends and Family: Not on file  . Attends Religious Services: Not on file  . Active Member of Clubs or Organizations: Not on file  . Attends BankerClub or Organization Meetings: Not on file  . Marital Status: Not on file  Intimate Partner Violence:   . Fear of Current or Ex-Partner: Not on file  . Emotionally Abused: Not on file  . Physically Abused: Not on file  . Sexually Abused: Not on file   Family History  Problem Relation Age of Onset  . Hypertension Mother   .  Neuropathy Father   . Hypertension Father   . Depression Father   . Heart attack Maternal Grandfather    Current Outpatient Medications on File Prior to Visit  Medication Sig  . acetaminophen (TYLENOL) 500 MG tablet Take 1,000 mg by mouth every 6 (six) hours as needed for moderate pain.   . Ascorbic Acid (VITAMIN C) 500 MG CAPS Take 500 mg by mouth.  Marland Kitchen aspirin EC 81 MG tablet Take 81 mg by mouth daily.  . flecainide (TAMBOCOR) 150 MG tablet Take 1 tablet (150 mg total) by mouth 2 (two) times daily.  Marland Kitchen ibuprofen (ADVIL,MOTRIN) 200 MG tablet Take 400 mg by mouth every 8 (eight) hours as needed for moderate pain.   . Probiotic Product (PROBIOTIC PO) Take 1 capsule by mouth daily.    No current facility-administered medications on file prior to visit.    Review of Systems  Constitutional: Negative for activity change, appetite change, chills, diaphoresis, fatigue and fever.  HENT: Negative for congestion and hearing loss.   Eyes: Negative for visual disturbance.  Respiratory: Negative for cough, chest tightness, shortness of breath and wheezing.   Cardiovascular: Negative for chest pain, palpitations and leg swelling.  Gastrointestinal: Negative for abdominal pain, anal bleeding, blood in stool, constipation, diarrhea, nausea and vomiting.  Endocrine: Negative for cold intolerance.  Genitourinary: Negative for dysuria, frequency and hematuria.  Musculoskeletal: Negative for arthralgias and neck pain.   Skin: Negative for rash.  Allergic/Immunologic: Negative for environmental allergies.  Neurological: Negative for dizziness, weakness, light-headedness, numbness and headaches.  Hematological: Negative for adenopathy.  Psychiatric/Behavioral: Negative for behavioral problems, dysphoric mood and sleep disturbance. The patient is not nervous/anxious.    Per HPI unless specifically indicated above     Objective:    BP 118/75   Pulse 63   Temp (!) 97.5 F (36.4 C) (Temporal)   Resp 16   Ht  (1.803 m)   Wt 195 lb 9.6 oz (88.7 kg)   SpO2 99%   BMI 27.28 kg/m   Wt Readings from Last 3 Encounters:  09/12/20 195 lb 9.6 oz (88.7 kg)  08/09/20 190 lb (86.2 kg)  03/07/20 192 lb 14.4 oz (87.5 kg)    Physical Exam Vitals and nursing note reviewed.  Constitutional:      General: He is not in acute distress.    Appearance: He is well-developed. He is obese. He is not diaphoretic.     Comments: Well-appearing, comfortable, cooperative  HENT:     Head: Normocephalic and atraumatic.  Eyes:     General:        Right eye: No discharge.        Left eye: No discharge.     Conjunctiva/sclera: Conjunctivae normal.     Pupils: Pupils are equal, round, and reactive to light.  Neck:     Thyroid: No thyromegaly.     Vascular: No carotid bruit.  Cardiovascular:     Rate and Rhythm: Normal rate and regular rhythm.     Heart sounds: Normal heart sounds. No murmur heard.      Comments: No ectopy Pulmonary:     Effort: Pulmonary effort is normal. No respiratory distress.     Breath sounds: Normal breath sounds. No wheezing or rales.  Abdominal:     General: Bowel sounds are normal. There is no distension.     Palpations: Abdomen is soft. There is no mass.     Tenderness: There is no abdominal tenderness.  Musculoskeletal:  General: No tenderness. Normal range of motion.     Cervical back: Normal range of motion and neck supple.     Comments: Upper / Lower Extremities: - Normal  muscle tone, strength bilateral upper extremities 5/5, lower extremities 5/5  Lymphadenopathy:     Cervical: No cervical adenopathy.  Skin:    General: Skin is warm and dry.     Findings: No erythema or rash.  Neurological:     Mental Status: He is alert and oriented to person, place, and time.     Comments: Distal sensation intact to light touch all extremities  Psychiatric:        Behavior: Behavior normal.     Comments: Well groomed, good eye contact, normal speech and thoughts        Results for orders placed or performed during the hospital encounter of 03/05/20  SARS CORONAVIRUS 2 (TAT 6-24 HRS) Nasopharyngeal Nasopharyngeal Swab   Specimen: Nasopharyngeal Swab  Result Value Ref Range   SARS Coronavirus 2 NEGATIVE NEGATIVE  Basic metabolic panel  Result Value Ref Range   Sodium 137 135 - 145 mmol/L   Potassium 3.9 3.5 - 5.1 mmol/L   Chloride 104 98 - 111 mmol/L   CO2 26 22 - 32 mmol/L   Glucose, Bld 91 70 - 99 mg/dL   BUN 15 6 - 20 mg/dL   Creatinine, Ser 9.79 0.61 - 1.24 mg/dL   Calcium 9.0 8.9 - 89.2 mg/dL   GFR calc non Af Amer >60 >60 mL/min   GFR calc Af Amer >60 >60 mL/min   Anion gap 7 5 - 15  CBC  Result Value Ref Range   WBC 4.4 4.0 - 10.5 K/uL   RBC 4.61 4.22 - 5.81 MIL/uL   Hemoglobin 13.0 13.0 - 17.0 g/dL   HCT 11.9 39 - 52 %   MCV 85.2 80.0 - 100.0 fL   MCH 28.2 26.0 - 34.0 pg   MCHC 33.1 30.0 - 36.0 g/dL   RDW 41.7 40.8 - 14.4 %   Platelets 250 150 - 400 K/uL   nRBC 0.0 0.0 - 0.2 %      Assessment & Plan:   Problem List Items Addressed This Visit    Paroxysmal atrial fibrillation (HCC)    Stable, controlled without episodic flares of AFib Followed by Dr Kirke Corin Cardiology On anti arrhythmic Flecainide       Relevant Medications   tadalafil (CIALIS) 5 MG tablet   Overweight (BMI 25.0-29.9)   OSA on CPAP    Well controlled, chronic OSA on CPAP, for years - Good adherence to CPAP nightly - Continue current CPAP therapy, patient seems to  be benefiting from therapy       Mixed hyperlipidemia    Labs ordered with fasting lipid The 10-year ASCVD risk score Denman George DC Jr., et al., 2013) is: 7.7%  Plan: 1. Continue ASA 81mg  for primary ASCVD risk reduction 2. No longer on Rosuvastatin prior PCP DC'd it 3. Encourage improved lifestyle - low carb/cholesterol, reduce portion size, continue improving regular exercise      Relevant Medications   tadalafil (CIALIS) 5 MG tablet   Other Relevant Orders   COMPLETE METABOLIC PANEL WITH GFR   Lipid panel   TSH   BPH (benign prostatic hyperplasia)    Followed by Dr [place] [facility] S/p Urolift 2021 some improvement BPH with LUTS On natural remedy Cannot take Flomax  Will re order Tadalafil, discussed possibility of daily dosing for BPH  Relevant Orders   PSA    Other Visit Diagnoses    Annual physical exam    -  Primary   Relevant Orders   Hemoglobin A1c   CBC with Differential/Platelet   COMPLETE METABOLIC PANEL WITH GFR   Lipid panel   PSA   Recurrent major depressive disorder, in full remission (HCC)       Restless legs syndrome (RLS)       Relevant Medications   pramipexole (MIRAPEX) 0.5 MG tablet   Other Relevant Orders   CBC with Differential/Platelet   COMPLETE METABOLIC PANEL WITH GFR   Recurrent cold sores       Relevant Medications   valACYclovir (VALTREX) 500 MG tablet   Erectile dysfunction, unspecified erectile dysfunction type       Relevant Medications   tadalafil (CIALIS) 5 MG tablet   Screening for prostate cancer       Relevant Orders   PSA   Abnormal glucose       Relevant Orders   Hemoglobin A1c   Screening for colon cancer       Relevant Orders   Cologuard      #ED Re order Tadalafil 5mg  daily PRN, 90 pills, goodrx [facility]  #Recurrent cold sores Valtrex suppression 500mg  daily re order  Updated Health Maintenance information Due for routine colon cancer screening.  - Discussion today about recommendations for  either Colonoscopy or Cologuard screening, benefits and risks of screening, interested in Cologuard, understands that if positive then recommendation is for diagnostic colonoscopy to follow-up. - Ordered Cologuard today  Reviewed recent lab results with patient Encouraged improvement to lifestyle with diet and exercise - Goal of weight loss   Meds ordered this encounter  Medications  . valACYclovir (VALTREX) 500 MG tablet    Sig: Take 1 tablet (500 mg total) by mouth daily.    Dispense:  90 tablet    Refill:  3  . tadalafil (CIALIS) 5 MG tablet    Sig: Take 1 tablet (5 mg total) by mouth daily as needed for erectile dysfunction.    Dispense:  90 tablet    Refill:  3  . pramipexole (MIRAPEX) 0.5 MG tablet    Sig: Take 1 tablet (0.5 mg total) by mouth in the morning and at bedtime.    Dispense:  180 tablet    Refill:  3    Follow up plan: Return in about 1 year (around 09/12/2021) for Follow-up 1 year Annual Physical (fasting lab AFTER visit).  , DO [facility] 09/12/2020, 9:17 AM

## 2020-09-12 NOTE — Patient Instructions (Addendum)
Thank you for coming to the office today.  Labs today, stay tuned for results.  Colon Cancer Screening: - For all adults age 58+ routine colon cancer screening is highly recommended.     - Recent guidelines from American Cancer Society recommend starting age of 11 - Early detection of colon cancer is important, because often there are no warning signs or symptoms, also if found early usually it can be cured. Late stage is hard to treat.  - If you are not interested in Colonoscopy screening (if done and normal you could be cleared for 5 to 10 years until next due), then Cologuard is an excellent alternative for screening test for Colon Cancer. It is highly sensitive for detecting DNA of colon cancer from even the earliest stages. Also, there is NO bowel prep required. - If Cologuard is NEGATIVE, then it is good for 3 years before next due - If Cologuard is POSITIVE, then it is strongly advised to get a Colonoscopy, which allows the GI doctor to locate the source of the cancer or polyp (even very early stage) and treat it by removing it. -------------------------   Follow instructions to collect sample, you may call the company for any help or questions, 24/7 telephone support at [phone].  Call them in 2 weeks if not received!  Please schedule a Follow-up Appointment to: Return in about 1 year (around 09/12/2021) for Follow-up 1 year Annual Physical (fasting lab AFTER visit).  If you have any other questions or concerns, please feel free to call the office or send a message through MyChart. You may also schedule an earlier appointment if necessary.  Additionally, you may be receiving a survey about your experience at our office within a few days to 1 week by e-mail or mail. We value your feedback.  Saralyn Pilar, DO [facility], [place]

## 2020-09-12 NOTE — Assessment & Plan Note (Signed)
Well controlled, chronic OSA on CPAP, for years - Good adherence to CPAP nightly - Continue current CPAP therapy, patient seems to be benefiting from therapy

## 2020-09-12 NOTE — Assessment & Plan Note (Signed)
Labs ordered with fasting lipid The 10-year ASCVD risk score Denman George DC Jr., et al., 2013) is: 7.7%  Plan: 1. Continue ASA 81mg  for primary ASCVD risk reduction 2. No longer on Rosuvastatin prior PCP DC'd it 3. Encourage improved lifestyle - low carb/cholesterol, reduce portion size, continue improving regular exercise

## 2020-09-13 LAB — LIPID PANEL
Cholesterol: 186 mg/dL (ref <200)
HDL: 40 mg/dL (ref >=40)
LDL Cholesterol (Calc): 106 mg/dL (calc) — ABNORMAL HIGH
Non-HDL Cholesterol (Calc): 146 mg/dL (calc) — ABNORMAL HIGH (ref <130)
Total CHOL/HDL Ratio: 4.7 (calc) (ref <5.0)
Triglycerides: 277 mg/dL — ABNORMAL HIGH (ref <150)

## 2020-09-13 LAB — CBC WITH DIFFERENTIAL/PLATELET
Absolute Monocytes: 301 cells/uL (ref 200–950)
Basophils Absolute: 31 cells/uL (ref 0–200)
Basophils Relative: 0.6 %
Eosinophils Absolute: 61 cells/uL (ref 15–500)
Eosinophils Relative: 1.2 %
HCT: 44.3 % (ref 38.5–50.0)
Hemoglobin: 14.4 g/dL (ref 13.2–17.1)
Lymphs Abs: 944 cells/uL (ref 850–3900)
MCH: 30.1 pg (ref 27.0–33.0)
MCHC: 32.5 g/dL (ref 32.0–36.0)
MCV: 92.7 fL (ref 80.0–100.0)
MPV: 10.5 fL (ref 7.5–12.5)
Monocytes Relative: 5.9 %
Neutro Abs: 3764 cells/uL (ref 1500–7800)
Neutrophils Relative %: 73.8 %
Platelets: 247 10*3/uL (ref 140–400)
RBC: 4.78 10*6/uL (ref 4.20–5.80)
RDW: 14 % (ref 11.0–15.0)
Total Lymphocyte: 18.5 %
WBC: 5.1 10*3/uL (ref 3.8–10.8)

## 2020-09-13 LAB — COMPLETE METABOLIC PANEL WITH GFR
AG Ratio: 2.1 (calc) (ref 1.0–2.5)
ALT: 13 U/L (ref 9–46)
AST: 15 U/L (ref 10–35)
Albumin: 4.5 g/dL (ref 3.6–5.1)
Alkaline phosphatase (APISO): 66 U/L (ref 35–144)
BUN: 14 mg/dL (ref 7–25)
CO2: 26 mmol/L (ref 20–32)
Calcium: 9.7 mg/dL (ref 8.6–10.3)
Chloride: 104 mmol/L (ref 98–110)
Creat: 1.02 mg/dL (ref 0.70–1.33)
GFR, Est African American: 93 mL/min/{1.73_m2} (ref >=60)
GFR, Est Non African American: 81 mL/min/{1.73_m2} (ref >=60)
Globulin: 2.1 g/dL (calc) (ref 1.9–3.7)
Glucose, Bld: 84 mg/dL (ref 65–99)
Potassium: 4.6 mmol/L (ref 3.5–5.3)
Sodium: 138 mmol/L (ref 135–146)
Total Bilirubin: 0.5 mg/dL (ref 0.2–1.2)
Total Protein: 6.6 g/dL (ref 6.1–8.1)

## 2020-09-13 LAB — PSA: PSA: 1.49 ng/mL (ref <=4.0)

## 2020-09-13 LAB — HEMOGLOBIN A1C
Hgb A1c MFr Bld: 5.3 % of total Hgb (ref <5.7)
Mean Plasma Glucose: 105 (calc)
eAG (mmol/L): 5.8 (calc)

## 2020-09-13 LAB — TSH: TSH: 1.04 mIU/L (ref 0.40–4.50)

## 2020-09-21 LAB — COLOGUARD: Cologuard: NEGATIVE

## 2020-10-06 DIAGNOSIS — F3342 Major depressive disorder, recurrent, in full remission: Secondary | ICD-10-CM

## 2020-10-07 MED ORDER — ESCITALOPRAM OXALATE 10 MG PO TABS: 10.0000 mg | ORAL_TABLET | Freq: Every day | ORAL | 0 refills | Status: DC

## 2020-10-31 ENCOUNTER — Encounter: Payer: Self-pay | Admitting: Cardiovascular Disease

## 2020-10-31 ENCOUNTER — Ambulatory Visit (INDEPENDENT_AMBULATORY_CARE_PROVIDER_SITE_OTHER): Payer: Managed Care, Other (non HMO) | Admitting: Cardiovascular Disease

## 2020-10-31 ENCOUNTER — Other Ambulatory Visit: Payer: Self-pay

## 2020-10-31 VITALS — BP 110/70 | HR 69 | Ht 71.0 in | Wt 196.4 lb

## 2020-10-31 DIAGNOSIS — I2510 Atherosclerotic heart disease of native coronary artery without angina pectoris: Secondary | ICD-10-CM | POA: Diagnosis not present

## 2020-10-31 DIAGNOSIS — E785 Hyperlipidemia, unspecified: Secondary | ICD-10-CM

## 2020-10-31 DIAGNOSIS — I4819 Other persistent atrial fibrillation: Secondary | ICD-10-CM | POA: Diagnosis not present

## 2020-10-31 DIAGNOSIS — G4730 Sleep apnea, unspecified: Secondary | ICD-10-CM

## 2020-10-31 NOTE — Progress Notes (Signed)
Cardiology Office Note   Date:  10/31/2020   ID:  Aaron Watson, DOB [birth year], MRN [id number]  PCP:  Smitty Cords, DO  Cardiologist:   Lorine Bears, MD   Chief Complaint  Patient presents with  . Follow-up    12 month/ Meds reviewed by the pt. verbally. "doing well."       History of Present Illness: Aaron Watson is a 59 y.o. male who presents for a follow-up visit regarding  atrial fibrillation maintained in sinus rhythm with flecainide.  He was diagnosed with  atrial fibrillation in 2008. He has been maintaining in sinus rhythm on flecainide since then. He had cardiac catheterization done in 2008 which showed no evidence of obstructive coronary artery disease. There was a 40% stenosis in the mid LAD.  He had a treadmill nuclear stress test in 2013, and which showed no evidence of ischemia with normal ejection fraction. He does have known history of sleep apnea on CPAP.   Most recent echocardiogram in January 2020 showed normal LV systolic function with no significant valvular abnormalities. He does have known history of mixed hyperlipidemia and took rosuvastatin for few years but then discontinued as he wanted to focus on lifestyle changes.    He has been doing very well with no recent chest pain, shortness of breath or palpitations.  Past Medical History:  Diagnosis Date  . Anxiety   . Arthritis    osteoarthritis  . Depression   . GERD (gastroesophageal reflux disease)   . Heart murmur   . Hyperlipidemia   . Itching   . OSA on CPAP   . PONV (postoperative nausea and vomiting)    Zofran intraop works well for pt  . Prostate disease   . Restless leg syndrome     Past Surgical History:  Procedure Laterality Date  . CARDIAC CATHETERIZATION  2008   [facility]: 40% mid LAD lesion. Otherwise insignificant/minimal CAD.  Marland Kitchen CERVICAL DISC ARTHROPLASTY N/A 12/29/2017   Procedure: CERVICAL ANTERIOR DISC ARTHROPLASTY-C5-7;  Surgeon: Venetia Night,  MD;  Location: [facility] ORS;  Service: Neurosurgery;  Laterality: N/A;  . CYSTOSCOPY WITH INSERTION OF UROLIFT N/A 03/07/2020   Procedure: CYSTOSCOPY WITH INSERTION OF UROLIFT;  Surgeon: Orson Ape, MD;  Location: [facility] ORS;  Service: Urology;  Laterality: N/A;  . FOOT SURGERY Bilateral   . NM MYOVIEW LTD  April 2008   Dr. Welton Flakes ([facility]) : Inferior wall defect, EF 55%. -- Suggested to the due to ischemia in RCA territory versus diaphragmatic attenuation normal EF.  Marland Kitchen NM MYOVIEW LTD  2013   Treadmill Myoview [facility]) no evidence of ischemia or infarction. Normal EF.  Marland Kitchen SHOULDER SURGERY Left   . TRANSTHORACIC ECHOCARDIOGRAM  July 20 13th; April 2016   At [facility]: a) normal LV function. Mild LA dilation, mild LVH and mild TR/MR. b) Normal LV size and function. EF 55-60%. No RW MA. Trivial MR and TR. Otherwise normal  . VASECTOMY       Current Outpatient Medications  Medication Sig Dispense Refill  . acetaminophen (TYLENOL) 500 MG tablet Take 1,000 mg by mouth every 6 (six) hours as needed for moderate pain.     . Ascorbic Acid (VITAMIN C) 500 MG CAPS Take 500 mg by mouth.    Marland Kitchen aspirin EC 81 MG tablet Take 81 mg by mouth daily.    Marland Kitchen escitalopram (LEXAPRO) 10 MG tablet Take 1 tablet (10 mg total) by mouth daily. 30 tablet 0  .  flecainide (TAMBOCOR) 150 MG tablet Take 1 tablet (150 mg total) by mouth 2 (two) times daily. 180 tablet 3  . ibuprofen (ADVIL,MOTRIN) 200 MG tablet Take 400 mg by mouth every 8 (eight) hours as needed for moderate pain.     . pramipexole (MIRAPEX) 0.5 MG tablet Take 1 tablet (0.5 mg total) by mouth in the morning and at bedtime. 180 tablet 3  . Probiotic Product (PROBIOTIC PO) Take 1 capsule by mouth daily.     . tadalafil (CIALIS) 5 MG tablet Take 1 tablet (5 mg total) by mouth daily as needed for erectile dysfunction. 90 tablet 3  . valACYclovir (VALTREX) 500 MG tablet Take 1 tablet (500 mg total) by mouth daily. 90 tablet 3   No current  facility-administered medications for this visit.    Allergies:   Other and Prednisone    Social History:  The patient  reports that he has quit smoking. His smoking use included cigarettes. He has a 5.00 pack-year smoking history. He has never used smokeless tobacco. He reports current alcohol use. He reports that he does not use drugs.   Family History:  The patient's family history includes Depression in his father; Heart attack in his maternal grandfather; Hypertension in his father and mother; Neuropathy in his father.    ROS:  Please see the history of present illness.   Otherwise, review of systems are positive for none.   All other systems are reviewed and negative.    PHYSICAL EXAM: VS:  BP 110/70 (BP Location: Left Arm, Patient Position: Sitting, Cuff Size: Normal)   Pulse 69   Ht 5\' 11"  (1.803 m)   Wt 196 lb 6 oz (89.1 kg)   SpO2 98%   BMI 27.39 kg/m  , BMI Body mass index is 27.39 kg/m. GEN: Well nourished, well developed, in no acute distress  HEENT: normal  Neck: no JVD, carotid bruits, or masses Cardiac: RRR; no murmurs, rubs, or gallops,no edema  Respiratory:  clear to auscultation bilaterally, normal work of breathing GI: soft, nontender, nondistended, + BS MS: no deformity or atrophy  Skin: warm and dry, no rash Neuro:  Strength and sensation are intact Psych: euthymic mood, full affect   EKG:  EKG is ordered today. The ekg ordered today demonstrates normal sinus rhythm with nonspecific IVCD.   Recent Labs: 09/12/2020: ALT 13; BUN 14; Creat 1.02; Hemoglobin 14.4; Platelets 247; Potassium 4.6; Sodium 138; TSH 1.04    Lipid Panel    Component Value Date/Time   CHOL 186 09/12/2020 0951   CHOL 193 05/16/2018 1449   CHOL CANCELED 11/02/2016 1448   TRIG 277 (H) 09/12/2020 0951   TRIG CANCELED 11/02/2016 1448   HDL 40 09/12/2020 0951   HDL 37 (L) 05/16/2018 1449   CHOLHDL 4.7 09/12/2020 0951   VLDL 40 04/17/2015 0708   LDLCALC 106 (H) 09/12/2020 0951        Wt Readings from Last 3 Encounters:  10/31/20 196 lb 6 oz (89.1 kg)  09/12/20 195 lb 9.6 oz (88.7 kg)  08/09/20 190 lb (86.2 kg)        No flowsheet data found.    ASSESSMENT AND PLAN:  1.  Persistent atrial fibrillation: Currently maintaining in sinus rhythm with high-dose flecainide with no significant arrhythmia.CAHDs VASc score is 0.  If he develops atrial fibrillation on flecainide, will proceed with ablation evaluation.  2. Sleep apnea : He uses CPAP regularly  3. Coronary artery disease involving native coronary artery without angina: He  had 40% LAD stenosis on previous cardiac catheterization with no anginal symptoms. Continue medical therapy.  4. Hyperlipidemia: Most recent lipid profile showed an LDL of 105 and mildly elevated triglyceride.  I discussed again the option of starting treatment with a statin given the presence of nonobstructive coronary artery disease.  The patient is still hesitant and wants to continue with lifestyle changes.    Disposition:   FU with me in 1 year  Signed,  Lorine Bears, MD  10/31/2020 8:06 AM    [facility]

## 2020-10-31 NOTE — Patient Instructions (Signed)
Medication Instructions:  Your physician recommends that you continue on your current medications as directed. Please refer to the Current Medication list given to you today.  *If you need a refill on your cardiac medications before your next appointment, please call your pharmacy*   Lab Work: None ordered If you have labs (blood work) drawn today and your tests are completely normal, you will receive your results only by: . MyChart Message (if you have MyChart) OR . A paper copy in the mail If you have any lab test that is abnormal or we need to change your treatment, we will call you to review the results.   Testing/Procedures: None ordered   Follow-Up: At [facility], you and your health needs are our priority.  As part of our continuing mission to provide you with exceptional heart care, we have created designated Provider Care Teams.  These Care Teams include your primary Cardiologist (physician) and Advanced Practice Providers (APPs -  Physician Assistants and Nurse Practitioners) who all work together to provide you with the care you need, when you need it.  We recommend signing up for the patient portal called "MyChart".  Sign up information is provided on this After Visit Summary.  MyChart is used to connect with patients for Virtual Visits (Telemedicine).  Patients are able to view lab/test results, encounter notes, upcoming appointments, etc.  Non-urgent messages can be sent to your provider as well.   To learn more about what you can do with MyChart, go to [email or web address].com.    Your next appointment:   12 month(s)  The format for your next appointment:   In Person  Provider:   You may see  Dr. Arida or one of the following Advanced Practice Providers on your designated Care Team:    Christopher Berge, NP  Ryan Dunn, PA-C  Jacquelyn Visser, PA-C  Cadence Furth, PA-C  Caitlin Walker, NP    Other Instructions N/A  

## 2020-11-05 MED ORDER — ESCITALOPRAM OXALATE 10 MG PO TABS: 10.0000 mg | ORAL_TABLET | Freq: Every day | ORAL | 1 refills | Status: DC

## 2020-11-05 NOTE — Addendum Note (Signed)
Addended by: Smitty Cords on: 11/05/2020 08:19 AM   Modules accepted: Orders

## 2020-11-11 ENCOUNTER — Other Ambulatory Visit: Payer: Self-pay | Admitting: Cardiovascular Disease

## 2020-11-11 MED ORDER — FLECAINIDE ACETATE 150 MG PO TABS: 150.0000 mg | ORAL_TABLET | Freq: Two times a day (BID) | ORAL | 3 refills | Status: DC

## 2020-11-11 NOTE — Telephone Encounter (Signed)
*  STAT* If patient is at the pharmacy, call can be transferred to refill team.   1. Which medications need to be refilled? (please list name of each medication and dose if known) Flecianide 150 mg bid  2. Which pharmacy/location (including street and city if local pharmacy) is medication to be sent to? [facility] [place]  3. Do they need a 30 day or 90 day supply? 90

## 2020-11-11 NOTE — Telephone Encounter (Signed)
Requested Prescriptions   Signed Prescriptions Disp Refills   flecainide (TAMBOCOR) 150 MG tablet 180 tablet 3    Sig: Take 1 tablet (150 mg total) by mouth 2 (two) times daily.    Authorizing Provider: Lorine Bears A    Ordering User: Kendrick Fries

## 2020-11-20 ENCOUNTER — Other Ambulatory Visit: Payer: Self-pay | Admitting: Orthopedic Surgery

## 2020-11-20 DIAGNOSIS — M25562 Pain in left knee: Secondary | ICD-10-CM

## 2020-11-20 DIAGNOSIS — M25362 Other instability, left knee: Secondary | ICD-10-CM

## 2020-11-20 DIAGNOSIS — M2392 Unspecified internal derangement of left knee: Secondary | ICD-10-CM

## 2020-11-21 ENCOUNTER — Other Ambulatory Visit: Payer: Self-pay

## 2020-11-21 ENCOUNTER — Ambulatory Visit
Admission: RE | Admit: 2020-11-21 | Discharge: 2020-11-21 | Disposition: A | Discharge status: Home / Self Care | Payer: Managed Care, Other (non HMO) | Source: Ambulatory Visit | Attending: Orthopedic Surgery | Admitting: Orthopedic Surgery

## 2020-11-21 DIAGNOSIS — M25362 Other instability, left knee: Secondary | ICD-10-CM

## 2020-11-21 DIAGNOSIS — M25562 Pain in left knee: Secondary | ICD-10-CM

## 2020-11-21 DIAGNOSIS — M2392 Unspecified internal derangement of left knee: Secondary | ICD-10-CM

## 2021-04-11 DIAGNOSIS — F3342 Major depressive disorder, recurrent, in full remission: Secondary | ICD-10-CM

## 2021-04-11 DIAGNOSIS — N529 Male erectile dysfunction, unspecified: Secondary | ICD-10-CM

## 2021-04-11 DIAGNOSIS — G2581 Restless legs syndrome: Secondary | ICD-10-CM

## 2021-04-11 DIAGNOSIS — B001 Herpesviral vesicular dermatitis: Secondary | ICD-10-CM

## 2021-04-11 MED ORDER — ESCITALOPRAM OXALATE 10 MG PO TABS: 10.0000 mg | ORAL_TABLET | Freq: Every day | ORAL | 1 refills | Status: DC

## 2021-04-11 MED ORDER — VALACYCLOVIR HCL 500 MG PO TABS: 500.0000 mg | ORAL_TABLET | Freq: Every day | ORAL | 1 refills | Status: DC

## 2021-04-11 MED ORDER — TADALAFIL 5 MG PO TABS: 5.0000 mg | ORAL_TABLET | Freq: Every day | ORAL | 1 refills | Status: AC | PRN

## 2021-04-11 MED ORDER — PRAMIPEXOLE DIHYDROCHLORIDE 0.5 MG PO TABS: 0.5000 mg | ORAL_TABLET | Freq: Two times a day (BID) | ORAL | 1 refills | Status: DC

## 2021-04-11 NOTE — Telephone Encounter (Signed)
Please review. Last office visit was 08/2020.  KP

## 2021-06-03 ENCOUNTER — Telehealth: Payer: Self-pay

## 2021-06-03 MED ORDER — FLECAINIDE ACETATE 150 MG PO TABS: 150.0000 mg | ORAL_TABLET | Freq: Two times a day (BID) | ORAL | 0 refills | Status: DC

## 2021-06-04 ENCOUNTER — Other Ambulatory Visit: Payer: Self-pay

## 2021-06-04 DIAGNOSIS — G2581 Restless legs syndrome: Secondary | ICD-10-CM

## 2021-06-04 DIAGNOSIS — B001 Herpesviral vesicular dermatitis: Secondary | ICD-10-CM

## 2021-06-04 DIAGNOSIS — F3342 Major depressive disorder, recurrent, in full remission: Secondary | ICD-10-CM

## 2021-06-04 MED ORDER — PRAMIPEXOLE DIHYDROCHLORIDE 0.5 MG PO TABS: 0.5000 mg | ORAL_TABLET | Freq: Two times a day (BID) | ORAL | 1 refills | Status: AC

## 2021-06-04 MED ORDER — ESCITALOPRAM OXALATE 10 MG PO TABS: 10.0000 mg | ORAL_TABLET | Freq: Every day | ORAL | 0 refills | Status: AC

## 2021-06-04 MED ORDER — VALACYCLOVIR HCL 500 MG PO TABS: 500.0000 mg | ORAL_TABLET | Freq: Every day | ORAL | 1 refills | Status: AC

## 2021-06-05 MED ORDER — FLECAINIDE ACETATE 150 MG PO TABS: 150.0000 mg | ORAL_TABLET | Freq: Two times a day (BID) | ORAL | 2 refills | Status: AC

## 2021-06-05 NOTE — Telephone Encounter (Signed)
Requested Prescriptions   Signed Prescriptions Disp Refills   flecainide (TAMBOCOR) 150 MG tablet 180 tablet 2    Sig: Take 1 tablet (150 mg total) by mouth 2 (two) times daily.    Authorizing Provider: Lorine Bears A    Ordering User: Kendrick Fries

## 2021-06-05 NOTE — Addendum Note (Signed)
Addended by: Kendrick Fries on: 06/05/2021 08:42 AM   Modules accepted: Orders

## 2021-06-05 NOTE — Telephone Encounter (Signed)
*  STAT* If patient is at the pharmacy, call can be transferred to refill team.   1. Which medications need to be refilled? (please list name of each medication and dose if known) flecainide 150 MG 1 tablet 2 times daily   2. Which pharmacy/location (including street and city if local pharmacy) is medication to be sent to? [facility]   3. Do they need a 30 day or 90 day supply? 90 day

## 2021-06-19 ENCOUNTER — Other Ambulatory Visit: Payer: Self-pay

## 2021-08-15 ENCOUNTER — Ambulatory Visit: Payer: Managed Care, Other (non HMO) | Admitting: Nurse Practitioner

## 2021-09-16 ENCOUNTER — Encounter: Payer: Managed Care, Other (non HMO) | Admitting: Family Medicine

## 2021-11-24 ENCOUNTER — Other Ambulatory Visit: Payer: Self-pay | Admitting: Family Medicine

## 2021-11-24 DIAGNOSIS — G2581 Restless legs syndrome: Secondary | ICD-10-CM

## 2021-11-24 NOTE — Telephone Encounter (Signed)
Pt. States he has a new PCP. 

## 2023-04-01 ENCOUNTER — Telehealth: Payer: Self-pay | Admitting: *Deleted

## 2023-04-01 NOTE — Telephone Encounter (Signed)
error
